# Patient Record
Sex: Male | Born: 1975 | Race: White | Hispanic: No | Marital: Married | Smoking: Current every day smoker
Health system: Southern US, Community
[De-identification: ages and names within clinical notes are randomized; demographics above are authoritative.]

## PROBLEM LIST (undated history)

## (undated) DIAGNOSIS — B019 Varicella without complication: Secondary | ICD-10-CM

## (undated) DIAGNOSIS — R519 Headache, unspecified: Secondary | ICD-10-CM

## (undated) DIAGNOSIS — R51 Headache: Secondary | ICD-10-CM

## (undated) HISTORY — PX: KNEE SURGERY: SHX244

## (undated) HISTORY — DX: Headache, unspecified: R51.9

## (undated) HISTORY — DX: Varicella without complication: B01.9

## (undated) HISTORY — DX: Headache: R51

---

## 2015-01-15 ENCOUNTER — Encounter: Payer: Self-pay | Admitting: Family Medicine

## 2015-01-15 ENCOUNTER — Ambulatory Visit (INDEPENDENT_AMBULATORY_CARE_PROVIDER_SITE_OTHER): Payer: 59 | Admitting: Family Medicine

## 2015-01-15 ENCOUNTER — Encounter (INDEPENDENT_AMBULATORY_CARE_PROVIDER_SITE_OTHER): Payer: Self-pay

## 2015-01-15 VITALS — BP 114/80 | HR 77 | Temp 98.7°F | Ht 73.5 in | Wt 250.5 lb

## 2015-01-15 DIAGNOSIS — R0683 Snoring: Secondary | ICD-10-CM

## 2015-01-15 DIAGNOSIS — R5383 Other fatigue: Secondary | ICD-10-CM

## 2015-01-15 DIAGNOSIS — G4733 Obstructive sleep apnea (adult) (pediatric): Secondary | ICD-10-CM | POA: Insufficient documentation

## 2015-01-15 DIAGNOSIS — E669 Obesity, unspecified: Secondary | ICD-10-CM | POA: Insufficient documentation

## 2015-01-15 DIAGNOSIS — R0681 Apnea, not elsewhere classified: Secondary | ICD-10-CM

## 2015-01-15 DIAGNOSIS — R0789 Other chest pain: Secondary | ICD-10-CM | POA: Insufficient documentation

## 2015-01-15 DIAGNOSIS — E66811 Obesity, class 1: Secondary | ICD-10-CM

## 2015-01-15 DIAGNOSIS — R071 Chest pain on breathing: Secondary | ICD-10-CM

## 2015-01-15 NOTE — Patient Instructions (Signed)
It was nice to see you today.  We will call with your lab results.   We will also call with the scheduling of your sleep study.  Follow up in 1-3 months.  Take care  Dr. Adriana Simas

## 2015-01-15 NOTE — Assessment & Plan Note (Addendum)
Atypical with normal EKG today. History not consistent with ischemia. Will continue to monitor and will consider referral to cardiology if persists.

## 2015-01-15 NOTE — Assessment & Plan Note (Signed)
Obtaining sleep study.

## 2015-01-15 NOTE — Progress Notes (Signed)
Pre visit review using our clinic review tool, if applicable. No additional management support is needed unless otherwise documented below in the visit note. 

## 2015-01-15 NOTE — Progress Notes (Signed)
Subjective:  Patient ID: Taylor Dalton, male    DOB: May 31, 1975  Age: 39 y.o. MRN: [id number]  CC: Fatigue, chest pain  HPI Taylor Dalton is a 39 y.o. male presents to the clinic today with the above complaints.  1) Fatigue  Has been going on for the past 2-3 months.  No recent illness.  He does not stress at his job.  Additionally, he has recently switched from days to nights.  His wife has also noted apnea while sleeping. He continues to snore loudly.  No exacerbating or relieving factors.  2) Chest pain  Has been occuring daily for the past month.  Pain is located centrally.   Described as a pressure sensation.  Lasts for a few mins to 15-20 mins then resolves spontaneously.  No association with exertion.  No relieving factors.  Exacerbated by breathing.  No associated nausea, vomiting, diaphoresis.   PMH, Surgical Hx, Family Hx, Social History reviewed and updated as below. Past Medical History  Diagnosis Date  . Chicken pox   . Headache    Past Surgical History  Procedure Laterality Date  . Knee surgery Right    Family History  Problem Relation Age of Onset  . Prostate cancer Paternal Grandmother   . Heart disease Father   . Cancer Brother    Social History  Substance Use Topics  . Smoking status: Former Smoker -- 1.00 packs/day    Types: Cigarettes    Quit date: 01/15/2012  . Smokeless tobacco: Never Used  . Alcohol Use: 1.2 - 1.8 oz/week    2-3 Standard drinks or equivalent per week   Review of Systems  Cardiovascular: Positive for chest pain.       Chest pain with breathing.  Gastrointestinal: Positive for diarrhea.  Neurological: Positive for headaches.  Psychiatric/Behavioral:       Stress.  All other systems negative.  Objective:   Today's Vitals: BP 114/80 mmHg  Pulse 77  Temp(Src) 98.7 F (37.1 C) (Oral)  Ht 6' 1.5" (1.867 m)  Wt 250 lb 8 oz (113.626 kg)  BMI 32.60 kg/m2  SpO2 97%  Physical Exam  Constitutional:  He is oriented to person, place, and time. He appears well-developed and well-nourished. No distress.  HENT:  Head: Normocephalic and atraumatic.  Mouth/Throat: No oropharyngeal exudate.  Eyes: Conjunctivae are normal. No scleral icterus.  Neck: Neck supple. No thyromegaly present.  Cardiovascular: Normal rate and regular rhythm.   No murmur heard. Pulmonary/Chest: Effort normal and breath sounds normal. No respiratory distress. He has no wheezes. He has no rales.  Chest wall mildly tender to palpation.  Abdominal: Soft. He exhibits no distension. There is no tenderness. There is no rebound and no guarding.  Musculoskeletal: Normal range of motion. He exhibits no edema.  Lymphadenopathy:    He has no cervical adenopathy.  Neurological: He is alert and oriented to person, place, and time.  Skin: Skin is warm and dry.  Psychiatric: He has a normal mood and affect.  Vitals reviewed.  EKG: normal EKG, normal sinus rhythm.  Assessment & Plan:   Problem List Items Addressed This Visit    Obesity (BMI 30.0-34.9)   Relevant Orders   Hemoglobin A1c   Comprehensive metabolic panel   Lipid panel   Apnea    Obtaining sleep study.      Relevant Orders   Split night study   Fatigue - Primary   Relevant Orders   CBC   TSH   Split night study  Chest pain    Atypical with normal EKG today. History not consistent with ischemia. Will continue to monitor and will consider referral to cardiology if persists.      Relevant Orders   EKG 12-Lead (Completed)   Snoring    Obtaining sleep study.      Relevant Orders   Split night study     Follow-up: 1-3 months.   Tommie Sams DO

## 2015-01-15 NOTE — Assessment & Plan Note (Signed)
Obtaining sleep study. 

## 2015-01-16 LAB — COMPREHENSIVE METABOLIC PANEL
ALT: 36 U/L (ref 0–53)
AST: 27 U/L (ref 0–37)
Albumin: 4.3 g/dL (ref 3.5–5.2)
Alkaline Phosphatase: 68 U/L (ref 39–117)
BUN: 12 mg/dL (ref 6–23)
CHLORIDE: 105 meq/L (ref 96–112)
CO2: 24 meq/L (ref 19–32)
Calcium: 9.6 mg/dL (ref 8.4–10.5)
Creatinine, Ser: 0.94 mg/dL (ref 0.40–1.50)
GFR: 94.47 mL/min (ref >60.00)
GLUCOSE: 79 mg/dL (ref 70–99)
POTASSIUM: 4.1 meq/L (ref 3.5–5.1)
SODIUM: 141 meq/L (ref 135–145)
Total Bilirubin: 0.4 mg/dL (ref 0.2–1.2)
Total Protein: 7.1 g/dL (ref 6.0–8.3)

## 2015-01-16 LAB — CBC
HCT: 48 % (ref 39.0–52.0)
Hemoglobin: 15.8 g/dL (ref 13.0–17.0)
MCHC: 32.9 g/dL (ref 30.0–36.0)
MCV: 84.3 fl (ref 78.0–100.0)
Platelets: 215 10*3/uL (ref 150.0–400.0)
RBC: 5.69 Mil/uL (ref 4.22–5.81)
RDW: 13.6 % (ref 11.5–15.5)
WBC: 9.1 10*3/uL (ref 4.0–10.5)

## 2015-01-16 LAB — HEMOGLOBIN A1C: HEMOGLOBIN A1C: 5.5 % (ref 4.6–6.5)

## 2015-01-16 LAB — LIPID PANEL
Cholesterol: 231 mg/dL — ABNORMAL HIGH (ref 0–200)
HDL: 57.3 mg/dL (ref >39.00)
LDL CALC: 139 mg/dL — AB (ref 0–99)
NONHDL: 173.22
Total CHOL/HDL Ratio: 4
Triglycerides: 171 mg/dL — ABNORMAL HIGH (ref 0.0–149.0)
VLDL: 34.2 mg/dL (ref 0.0–40.0)

## 2015-01-16 LAB — TSH: TSH: 1.3 u[IU]/mL (ref 0.35–4.50)

## 2016-03-03 ENCOUNTER — Encounter: Payer: Self-pay | Admitting: Family Medicine

## 2016-03-03 ENCOUNTER — Ambulatory Visit (INDEPENDENT_AMBULATORY_CARE_PROVIDER_SITE_OTHER): Payer: 59 | Admitting: Family Medicine

## 2016-03-03 DIAGNOSIS — R0681 Apnea, not elsewhere classified: Secondary | ICD-10-CM | POA: Diagnosis not present

## 2016-03-03 DIAGNOSIS — E669 Obesity, unspecified: Secondary | ICD-10-CM | POA: Diagnosis not present

## 2016-03-03 MED ORDER — LIRAGLUTIDE -WEIGHT MANAGEMENT 18 MG/3ML ~~LOC~~ SOPN: 0.6000 mg | PEN_INJECTOR | Freq: Every day | SUBCUTANEOUS | 0 refills | Status: DC

## 2016-03-03 NOTE — Progress Notes (Signed)
Subjective:  Patient ID: Taylor Dalton, male    DOB: [birth year]  Age: 40 y.o. MRN: [id number]  CC: Issues regarding sleep, discuss weight loss  HPI:  40 year old male with obesity and presumed OSA presents with the above issues.  Patient reports that he's not sleeping well. He states that he is waking up with a headache. He states that he finds it difficult to stay awake during the day. He reports significant snoring and witnessed apnea by his wife. At our last visit, I ordered a sleep study which she has not proceeded with.  Regarding his obesity, he would like to discuss weight loss options. He is particularly interested in weight loss medication. He states that he has been changing his dietary habits and has had some success. He is exercising very little. He does golf somewhat regularly. He states that his wife is on a weight loss medication and has lost a significant amount of weight. He would like to discuss starting this today.  Social Hx   Social History   Social History  . Marital status: Married    Spouse name: N/A  . Number of children: N/A  . Years of education: N/A   Social History Main Topics  . Smoking status: Former Smoker    Packs/day: 1.00    Types: Cigarettes    Quit date: 01/15/2012  . Smokeless tobacco: Never Used  . Alcohol use 1.2 - 1.8 oz/week    2 - 3 Standard drinks or equivalent per week  . Drug use: No  . Sexual activity: Yes   Other Topics Concern  . None   Social History Narrative  . None    Review of Systems  Constitutional: Positive for fatigue.  Respiratory: Positive for apnea.   Psychiatric/Behavioral: Positive for sleep disturbance.   Objective:  BP 118/82 (BP Location: Right Arm, Patient Position: Sitting, Cuff Size: Normal)   Pulse (!) 112   Temp 98.5 F (36.9 C) (Oral)   Resp 16   Wt 246 lb 2 oz (111.6 kg)   SpO2 96%   BMI 32.03 kg/m   BP/Weight 03/03/2016 01/15/2015  Systolic BP 118 114  Diastolic BP 82 80  Wt. (Lbs)  246.13 250.5  BMI 32.03 32.6   Physical Exam  Constitutional: He is oriented to person, place, and time. He appears well-developed. No distress.  Cardiovascular: Regular rhythm.  Tachycardia present.   Pulmonary/Chest: Effort normal. He has no wheezes. He has no rales.  Neurological: He is alert and oriented to person, place, and time.  Psychiatric: He has a normal mood and affect.  Vitals reviewed.  Lab Results  Component Value Date   WBC 9.1 01/15/2015   HGB 15.8 01/15/2015   HCT 48.0 01/15/2015   PLT 215.0 01/15/2015   GLUCOSE 79 01/15/2015   CHOL 231 (H) 01/15/2015   TRIG 171.0 (H) 01/15/2015   HDL 57.30 01/15/2015   LDLCALC 139 (H) 01/15/2015   ALT 36 01/15/2015   AST 27 01/15/2015   NA 141 01/15/2015   K 4.1 01/15/2015   CL 105 01/15/2015   CREATININE 0.94 01/15/2015   BUN 12 01/15/2015   CO2 24 01/15/2015   TSH 1.30 01/15/2015   HGBA1C 5.5 01/15/2015    Assessment & Plan:   Problem List Items Addressed This Visit    Obesity (BMI 30.0-34.9)    Established problem, improving slightly. After discussion, patient was adamant about starting weight loss medication. Trial of Saxenda.      Relevant Medications  Liraglutide -Weight Management (SAXENDA) 18 MG/3ML SOPN   Apnea    Established problem, worsening. Patient sleep disturbance is secondary to obstructive sleep apnea. Patient needs sleep study to confirm and to titrate CPAP. Also needs to lose weight.      Relevant Orders   Split night study      Meds ordered this encounter  Medications  . Liraglutide -Weight Management (SAXENDA) 18 MG/3ML SOPN    Sig: Inject 0.6 mg into the skin daily. Increase by 0.6 mg daily at weekly intervals to a target dose of 3 mg once daily    Dispense:  15 mL    Refill:  0    Follow-up: 3 months.  Everlene OtherJayce Camry Robello DO [facility]

## 2016-03-03 NOTE — Assessment & Plan Note (Signed)
Established problem, improving slightly. After discussion, patient was adamant about starting weight loss medication. Trial of Saxenda.

## 2016-03-03 NOTE — Assessment & Plan Note (Signed)
Established problem, worsening. Patient sleep disturbance is secondary to obstructive sleep apnea. Patient needs sleep study to confirm and to titrate CPAP. Also needs to lose weight.

## 2016-03-03 NOTE — Patient Instructions (Signed)
We will call regarding your sleep study.  Try the saxenda.  Follow up in 3 months.  Take care  Dr. Adriana Simasook

## 2016-03-03 NOTE — Progress Notes (Signed)
Pre visit review using our clinic review tool, if applicable. No additional management support is needed unless otherwise documented below in the visit note. 

## 2016-03-04 ENCOUNTER — Encounter: Payer: Self-pay | Admitting: Family Medicine

## 2016-03-05 ENCOUNTER — Telehealth: Payer: Self-pay

## 2016-03-05 NOTE — Telephone Encounter (Signed)
PA for Saxenda completed on Cover my meds 

## 2016-03-21 ENCOUNTER — Encounter: Payer: Self-pay | Admitting: Family Medicine

## 2016-05-06 ENCOUNTER — Ambulatory Visit: Payer: Commercial Managed Care - PPO | Attending: Otolaryngology

## 2016-05-06 DIAGNOSIS — G4733 Obstructive sleep apnea (adult) (pediatric): Secondary | ICD-10-CM | POA: Insufficient documentation

## 2016-05-06 DIAGNOSIS — R0683 Snoring: Secondary | ICD-10-CM | POA: Insufficient documentation

## 2016-05-12 ENCOUNTER — Other Ambulatory Visit: Payer: Self-pay | Admitting: Family Medicine

## 2016-05-13 ENCOUNTER — Other Ambulatory Visit: Payer: Self-pay | Admitting: Family Medicine

## 2016-05-13 NOTE — Telephone Encounter (Signed)
Refilled 03/03/16. Pt last seen 03/03/16. Please advise?

## 2016-05-15 ENCOUNTER — Telehealth: Payer: Self-pay | Admitting: *Deleted

## 2016-05-15 NOTE — Telephone Encounter (Signed)
Pt requested sleep study results if they are available  Pt contact [phone]

## 2016-05-15 NOTE — Telephone Encounter (Signed)
Has moderate to severe OSA. Did not have enough events for split night study. Needs study for titration of CPAP.

## 2016-05-15 NOTE — Telephone Encounter (Signed)
Called pt unable to leave voicemail due to inbox full.

## 2016-05-15 NOTE — Telephone Encounter (Signed)
No results available yet (to me)

## 2016-05-15 NOTE — Telephone Encounter (Signed)
Information is in the patients chart scanned. Printed for PCP.

## 2016-05-16 NOTE — Telephone Encounter (Signed)
Unable to leave a voicemail due to bother numbers not having the ability to leave a message. Pleas have pt transferred back to me.

## 2016-05-16 NOTE — Telephone Encounter (Signed)
Pt okay with titration study.

## 2016-05-16 NOTE — Telephone Encounter (Signed)
Asked to call this number [phone]

## 2016-05-16 NOTE — Telephone Encounter (Signed)
Pt returned your call. [phone]

## 2016-05-19 ENCOUNTER — Other Ambulatory Visit: Payer: Self-pay | Admitting: Family Medicine

## 2016-05-19 DIAGNOSIS — G4733 Obstructive sleep apnea (adult) (pediatric): Secondary | ICD-10-CM

## 2016-05-20 ENCOUNTER — Other Ambulatory Visit: Payer: Self-pay | Admitting: Family Medicine

## 2016-05-20 ENCOUNTER — Encounter: Payer: Self-pay | Admitting: Family Medicine

## 2016-05-26 ENCOUNTER — Telehealth: Payer: Self-pay | Admitting: *Deleted

## 2016-05-26 NOTE — Telephone Encounter (Signed)
Pt requested a call for the status of his sleep study referral  Pt contact [phone]

## 2016-06-05 ENCOUNTER — Ambulatory Visit (INDEPENDENT_AMBULATORY_CARE_PROVIDER_SITE_OTHER): Payer: Commercial Managed Care - PPO | Admitting: Family Medicine

## 2016-06-05 VITALS — BP 125/78 | HR 81 | Temp 97.8°F | Wt 239.6 lb

## 2016-06-05 DIAGNOSIS — Z130 Encounter for screening for diseases of the blood and blood-forming organs and certain disorders involving the immune mechanism: Secondary | ICD-10-CM

## 2016-06-05 DIAGNOSIS — E669 Obesity, unspecified: Secondary | ICD-10-CM | POA: Diagnosis not present

## 2016-06-05 DIAGNOSIS — E785 Hyperlipidemia, unspecified: Secondary | ICD-10-CM | POA: Diagnosis not present

## 2016-06-05 DIAGNOSIS — G4733 Obstructive sleep apnea (adult) (pediatric): Secondary | ICD-10-CM | POA: Diagnosis not present

## 2016-06-05 LAB — CBC
HCT: 44.6 % (ref 39.0–52.0)
Hemoglobin: 15.1 g/dL (ref 13.0–17.0)
MCHC: 33.8 g/dL (ref 30.0–36.0)
MCV: 83.3 fl (ref 78.0–100.0)
PLATELETS: 200 10*3/uL (ref 150.0–400.0)
RBC: 5.36 Mil/uL (ref 4.22–5.81)
RDW: 13.8 % (ref 11.5–15.5)
WBC: 6.9 10*3/uL (ref 4.0–10.5)

## 2016-06-05 LAB — LIPID PANEL
CHOL/HDL RATIO: 4
CHOLESTEROL: 208 mg/dL — AB (ref 0–200)
HDL: 48.8 mg/dL (ref >39.00)
LDL CALC: 140 mg/dL — AB (ref 0–99)
NonHDL: 159.61
Triglycerides: 99 mg/dL (ref 0.0–149.0)
VLDL: 19.8 mg/dL (ref 0.0–40.0)

## 2016-06-05 LAB — COMPREHENSIVE METABOLIC PANEL
ALT: 32 U/L (ref 0–53)
AST: 23 U/L (ref 0–37)
Albumin: 4.2 g/dL (ref 3.5–5.2)
Alkaline Phosphatase: 66 U/L (ref 39–117)
BUN: 12 mg/dL (ref 6–23)
CHLORIDE: 107 meq/L (ref 96–112)
CO2: 31 meq/L (ref 19–32)
Calcium: 9.2 mg/dL (ref 8.4–10.5)
Creatinine, Ser: 0.85 mg/dL (ref 0.40–1.50)
GFR: 105.38 mL/min (ref >60.00)
Glucose, Bld: 106 mg/dL — ABNORMAL HIGH (ref 70–99)
POTASSIUM: 3.9 meq/L (ref 3.5–5.1)
SODIUM: 141 meq/L (ref 135–145)
Total Bilirubin: 0.4 mg/dL (ref 0.2–1.2)
Total Protein: 6.6 g/dL (ref 6.0–8.3)

## 2016-06-05 LAB — HEMOGLOBIN A1C: HEMOGLOBIN A1C: 5.5 % (ref 4.6–6.5)

## 2016-06-05 NOTE — Assessment & Plan Note (Signed)
Doing well. Losing weight on Saxenda. Will continue. Follow up in 4 months.

## 2016-06-05 NOTE — Progress Notes (Signed)
Pre visit review using our clinic review tool, if applicable. No additional management support is needed unless otherwise documented below in the visit note. 

## 2016-06-05 NOTE — Progress Notes (Signed)
Subjective:  Patient ID: Taylor Dalton, male    DOB: 18-Sep-1975  Age: 41 y.o. MRN: [id number]  CC: Follow up   HPI:  41 year old male presents for follow-up.  Obesity  Patient has been losing weight following start of Saxenda.  He has not been on this particularly long as he had issues regarding his insurance.  No reported side effects.  He would like to continue.  OSA  Uncontrolled. Patient has a diagnosis of sleep apnea based on a recent sleep study.  Per the sleep study he did not qualify for split night study and he is scheduled for a titration study.  He continues to have significant apnea, fatigue, and heavy snoring.  Upcoming titration study in March.  Social Hx   Social History   Social History  . Marital status: Married    Spouse name: N/A  . Number of children: N/A  . Years of education: N/A   Social History Main Topics  . Smoking status: Former Smoker    Packs/day: 1.00    Types: Cigarettes    Quit date: 01/15/2012  . Smokeless tobacco: Never Used  . Alcohol use 1.2 - 1.8 oz/week    2 - 3 Standard drinks or equivalent per week  . Drug use: No  . Sexual activity: Yes   Other Topics Concern  . Not on file   Social History Narrative  . No narrative on file   Review of Systems  Constitutional: Negative.   Respiratory: Positive for apnea.    Objective:  BP 125/78   Pulse 81   Temp 97.8 F (36.6 C) (Oral)   Wt 239 lb 9.6 oz (108.7 kg)   SpO2 95%   BMI 31.18 kg/m   BP/Weight 06/05/2016 03/03/2016 01/15/2015  Systolic BP 125 118 114  Diastolic BP 78 82 80  Wt. (Lbs) 239.6 246.13 250.5  BMI 31.18 32.03 32.6   Physical Exam  Constitutional: He is oriented to person, place, and time. He appears well-developed. No distress.  Cardiovascular: Normal rate and regular rhythm.   Pulmonary/Chest: Effort normal and breath sounds normal.  Abdominal: Soft. He exhibits no distension. There is no tenderness. There is no rebound and no guarding.    Neurological: He is alert and oriented to person, place, and time.  Psychiatric: He has a normal mood and affect.  Vitals reviewed.  Lab Results  Component Value Date   WBC 9.1 01/15/2015   HGB 15.8 01/15/2015   HCT 48.0 01/15/2015   PLT 215.0 01/15/2015   GLUCOSE 79 01/15/2015   CHOL 231 (H) 01/15/2015   TRIG 171.0 (H) 01/15/2015   HDL 57.30 01/15/2015   LDLCALC 139 (H) 01/15/2015   ALT 36 01/15/2015   AST 27 01/15/2015   NA 141 01/15/2015   K 4.1 01/15/2015   CL 105 01/15/2015   CREATININE 0.94 01/15/2015   BUN 12 01/15/2015   CO2 24 01/15/2015   TSH 1.30 01/15/2015   HGBA1C 5.5 01/15/2015    Assessment & Plan:   Problem List Items Addressed This Visit    OSA (obstructive sleep apnea)    Established problem, uncontrolled. Awaiting titration study so that he can get CPAP.      Obesity (BMI 30.0-34.9) - Primary    Doing well. Losing weight on Saxenda. Will continue. Follow up in 4 months.      Relevant Orders   Hemoglobin A1c   Comprehensive metabolic panel    Other Visit Diagnoses    Hyperlipidemia, unspecified hyperlipidemia type  Relevant Orders   Lipid panel   Screening for deficiency anemia       Relevant Orders   CBC     Follow-up: 4 months  Taylor Bahe Adriana Simas DO [facility]

## 2016-06-05 NOTE — Patient Instructions (Signed)
Continue the Saxenda.   Be sure to get your titration study.  We will call with your labs.  Follow up in 4 months (recommended since on Saxenda).  Take care  Dr. Adriana Simasook

## 2016-06-05 NOTE — Assessment & Plan Note (Signed)
Established problem, uncontrolled. Awaiting titration study so that he can get CPAP.

## 2016-06-17 ENCOUNTER — Other Ambulatory Visit: Payer: Self-pay | Admitting: Family Medicine

## 2016-06-17 NOTE — Telephone Encounter (Signed)
Refilled 05/13/16. Pt last seen 06/05/16. Please advise?

## 2016-07-15 ENCOUNTER — Other Ambulatory Visit: Payer: Self-pay | Admitting: Family Medicine

## 2016-07-16 ENCOUNTER — Other Ambulatory Visit: Payer: Self-pay | Admitting: Family Medicine

## 2016-07-16 MED ORDER — LIRAGLUTIDE -WEIGHT MANAGEMENT 18 MG/3ML ~~LOC~~ SOPN: 3.0000 mg | PEN_INJECTOR | Freq: Every day | SUBCUTANEOUS | 0 refills | Status: DC

## 2016-07-16 NOTE — Telephone Encounter (Signed)
Refilled: 06/18/16 Last OV: 06/05/16 Last Labs: 10/03/16 Future OV: 06/05/16 Please advise?

## 2016-08-30 ENCOUNTER — Other Ambulatory Visit: Payer: Self-pay | Admitting: Family Medicine

## 2016-09-01 MED ORDER — LIRAGLUTIDE -WEIGHT MANAGEMENT 18 MG/3ML ~~LOC~~ SOPN: 3.0000 mg | PEN_INJECTOR | Freq: Every day | SUBCUTANEOUS | 0 refills | Status: DC

## 2016-09-01 NOTE — Telephone Encounter (Signed)
Refilled: 07/16/16 Last OV: 06/05/16 Last Labs: 06/05/16 Future OV: 10/03/16 Please advise?

## 2016-10-03 ENCOUNTER — Encounter: Payer: Self-pay | Admitting: Family Medicine

## 2016-10-03 ENCOUNTER — Ambulatory Visit (INDEPENDENT_AMBULATORY_CARE_PROVIDER_SITE_OTHER): Payer: Commercial Managed Care - PPO | Admitting: Family Medicine

## 2016-10-03 VITALS — BP 118/72 | HR 75 | Temp 98.4°F | Wt 233.0 lb

## 2016-10-03 DIAGNOSIS — E669 Obesity, unspecified: Secondary | ICD-10-CM | POA: Diagnosis not present

## 2016-10-03 DIAGNOSIS — M62838 Other muscle spasm: Secondary | ICD-10-CM | POA: Diagnosis not present

## 2016-10-03 DIAGNOSIS — E785 Hyperlipidemia, unspecified: Secondary | ICD-10-CM | POA: Insufficient documentation

## 2016-10-03 MED ORDER — CYCLOBENZAPRINE HCL 10 MG PO TABS: 10.0000 mg | ORAL_TABLET | Freq: Every evening | ORAL | 0 refills | Status: AC | PRN

## 2016-10-03 MED ORDER — LIRAGLUTIDE -WEIGHT MANAGEMENT 18 MG/3ML ~~LOC~~ SOPN: 3.0000 mg | PEN_INJECTOR | Freq: Every day | SUBCUTANEOUS | 2 refills | Status: DC

## 2016-10-03 NOTE — Assessment & Plan Note (Signed)
New problem. Flexeril as needed. Wants to see massage therapist.

## 2016-10-03 NOTE — Assessment & Plan Note (Signed)
Doing well. Losing weight. Continue Saxenda.

## 2016-10-03 NOTE — Patient Instructions (Signed)
Flexeril as needed for spasm. See a Masseuse.  Continue the Saxenda.  Follow up in 6 months.  Take care  Dr. Adriana Simasook

## 2016-10-03 NOTE — Progress Notes (Signed)
Subjective:  Patient ID: Taylor Dalton, male    DOB: 10-03-1975  Age: 41 y.o. MRN: [id number]  CC: Shoulder/scapular pain, Medication refill  HPI:  41 year old male with OSA, obesity, and hyperlipidemia presents for follow-up.  Obesity  Has lost 13 pounds on Saxenda.  No adverse effects.  He's happy with his current results.  He would like to continue. Needs refill today.  Right shoulder pain/periscapular pain  Has had prior injury.  He states that 2 weeks ago he slipped in the garden and injured his right shoulder. He had radicular symptoms at that time. He saw a chiropractor with improvement.  He states that he's been doing fairly well, but is now developed "knots" in his upper back/periscapular region.  Tender to palpation. No known relieving factors.  He would like to address this today.  Social Hx   Social History   Social History  . Marital status: Married    Spouse name: N/A  . Number of children: N/A  . Years of education: N/A   Social History Main Topics  . Smoking status: Former Smoker    Packs/day: 1.00    Types: Cigarettes    Quit date: 01/15/2012  . Smokeless tobacco: Never Used  . Alcohol use 1.2 - 1.8 oz/week    2 - 3 Standard drinks or equivalent per week  . Drug use: No  . Sexual activity: Yes   Other Topics Concern  . None   Social History Narrative  . None    Review of Systems  Constitutional: Negative.   Musculoskeletal:       Shoudler/scapular pain.   Objective:  BP 118/72 (BP Location: Left Arm, Patient Position: Sitting, Cuff Size: Normal)   Pulse 75   Temp 98.4 F (36.9 C) (Oral)   Wt 233 lb (105.7 kg)   SpO2 98%   BMI 30.32 kg/m   BP/Weight 10/03/2016 06/05/2016 03/03/2016  Systolic BP 118 125 118  Diastolic BP 72 78 82  Wt. (Lbs) 233 239.6 246.13  BMI 30.32 31.18 32.03   Physical Exam  Constitutional: He is oriented to person, place, and time. He appears well-developed. No distress.  Cardiovascular: Normal rate  and regular rhythm.   Pulmonary/Chest: Effort normal and breath sounds normal.  Musculoskeletal:  Periscapular region with muscle spasm.  Neurological: He is alert and oriented to person, place, and time.  Psychiatric: He has a normal mood and affect.  Vitals reviewed.   Lab Results  Component Value Date   WBC 6.9 06/05/2016   HGB 15.1 06/05/2016   HCT 44.6 06/05/2016   PLT 200.0 06/05/2016   GLUCOSE 106 (H) 06/05/2016   CHOL 208 (H) 06/05/2016   TRIG 99.0 06/05/2016   HDL 48.80 06/05/2016   LDLCALC 140 (H) 06/05/2016   ALT 32 06/05/2016   AST 23 06/05/2016   NA 141 06/05/2016   K 3.9 06/05/2016   CL 107 06/05/2016   CREATININE 0.85 06/05/2016   BUN 12 06/05/2016   CO2 31 06/05/2016   TSH 1.30 01/15/2015   HGBA1C 5.5 06/05/2016    Assessment & Plan:   Problem List Items Addressed This Visit    Obesity (BMI 30.0-34.9) - Primary    Doing well. Losing weight. Continue Saxenda.      Relevant Medications   Liraglutide -Weight Management (SAXENDA) 18 MG/3ML SOPN   Muscle spasm    New problem. Flexeril as needed. Wants to see massage therapist.      Relevant Orders   Ambulatory referral to Physical  Therapy      Meds ordered this encounter  Medications  . Liraglutide -Weight Management (SAXENDA) 18 MG/3ML SOPN    Sig: Inject 3 mg into the skin daily.    Dispense:  15 pen    Refill:  2  . cyclobenzaprine (FLEXERIL) 10 MG tablet    Sig: Take 1 tablet (10 mg total) by mouth at bedtime as needed for muscle spasms.    Dispense:  30 tablet    Refill:  0    Follow-up: 6 months  Teirra Carapia Adriana Simasook DO [facility] [place] [facility]

## 2016-10-24 ENCOUNTER — Encounter: Payer: Self-pay | Admitting: Family Medicine

## 2016-10-24 MED ORDER — LIRAGLUTIDE -WEIGHT MANAGEMENT 18 MG/3ML ~~LOC~~ SOPN: 3.0000 mg | PEN_INJECTOR | Freq: Every day | SUBCUTANEOUS | 3 refills | Status: DC

## 2016-12-04 ENCOUNTER — Observation Stay (HOSPITAL_COMMUNITY)
Admission: EM | Admit: 2016-12-04 | Discharge: 2016-12-05 | Disposition: A | Discharge status: Home / Self Care | Payer: Commercial Managed Care - PPO | Attending: Family Medicine | Admitting: Family Medicine

## 2016-12-04 ENCOUNTER — Other Ambulatory Visit: Payer: Self-pay

## 2016-12-04 ENCOUNTER — Encounter (HOSPITAL_COMMUNITY): Payer: Self-pay | Admitting: Emergency Medicine

## 2016-12-04 ENCOUNTER — Emergency Department (HOSPITAL_COMMUNITY): Payer: Commercial Managed Care - PPO

## 2016-12-04 DIAGNOSIS — I200 Unstable angina: Secondary | ICD-10-CM | POA: Diagnosis not present

## 2016-12-04 DIAGNOSIS — Z79899 Other long term (current) drug therapy: Secondary | ICD-10-CM | POA: Insufficient documentation

## 2016-12-04 DIAGNOSIS — Z87891 Personal history of nicotine dependence: Secondary | ICD-10-CM | POA: Insufficient documentation

## 2016-12-04 DIAGNOSIS — E669 Obesity, unspecified: Secondary | ICD-10-CM | POA: Diagnosis present

## 2016-12-04 DIAGNOSIS — R0789 Other chest pain: Secondary | ICD-10-CM | POA: Diagnosis present

## 2016-12-04 DIAGNOSIS — R079 Chest pain, unspecified: Secondary | ICD-10-CM | POA: Diagnosis present

## 2016-12-04 DIAGNOSIS — E66811 Obesity, class 1: Secondary | ICD-10-CM | POA: Diagnosis present

## 2016-12-04 DIAGNOSIS — E785 Hyperlipidemia, unspecified: Secondary | ICD-10-CM | POA: Diagnosis present

## 2016-12-04 LAB — I-STAT TROPONIN, ED: Troponin i, poc: 0 ng/mL (ref 0.00–0.08)

## 2016-12-04 LAB — BASIC METABOLIC PANEL
Anion gap: 10 (ref 5–15)
BUN: 11 mg/dL (ref 6–20)
CHLORIDE: 108 mmol/L (ref 101–111)
CO2: 22 mmol/L (ref 22–32)
CREATININE: 0.86 mg/dL (ref 0.61–1.24)
Calcium: 9.1 mg/dL (ref 8.9–10.3)
GFR calc non Af Amer: >60 mL/min (ref >60)
Glucose, Bld: 93 mg/dL (ref 65–99)
POTASSIUM: 3.9 mmol/L (ref 3.5–5.1)
SODIUM: 140 mmol/L (ref 135–145)

## 2016-12-04 LAB — CBC
HEMATOCRIT: 44.3 % (ref 39.0–52.0)
HEMOGLOBIN: 15.3 g/dL (ref 13.0–17.0)
MCH: 28.1 pg (ref 26.0–34.0)
MCHC: 34.5 g/dL (ref 30.0–36.0)
MCV: 81.4 fL (ref 78.0–100.0)
Platelets: 201 10*3/uL (ref 150–400)
RBC: 5.44 MIL/uL (ref 4.22–5.81)
RDW: 13 % (ref 11.5–15.5)
WBC: 7.8 10*3/uL (ref 4.0–10.5)

## 2016-12-04 NOTE — ED Triage Notes (Signed)
Pt sts left sided CP with radiation to left arm and some tightness

## 2016-12-05 ENCOUNTER — Observation Stay (HOSPITAL_BASED_OUTPATIENT_CLINIC_OR_DEPARTMENT_OTHER): Payer: Commercial Managed Care - PPO

## 2016-12-05 DIAGNOSIS — R079 Chest pain, unspecified: Secondary | ICD-10-CM

## 2016-12-05 DIAGNOSIS — I200 Unstable angina: Secondary | ICD-10-CM | POA: Diagnosis not present

## 2016-12-05 DIAGNOSIS — E669 Obesity, unspecified: Secondary | ICD-10-CM

## 2016-12-05 DIAGNOSIS — E7800 Pure hypercholesterolemia, unspecified: Secondary | ICD-10-CM

## 2016-12-05 DIAGNOSIS — Z87891 Personal history of nicotine dependence: Secondary | ICD-10-CM | POA: Diagnosis not present

## 2016-12-05 DIAGNOSIS — Z79899 Other long term (current) drug therapy: Secondary | ICD-10-CM | POA: Diagnosis not present

## 2016-12-05 LAB — EXERCISE TOLERANCE TEST
CHL CUP MPHR: 179 {beats}/min
CSEPED: 10 min
CSEPPHR: 176 {beats}/min
Estimated workload: 11.7 METS
Percent HR: 98 %
Rest HR: 57 {beats}/min

## 2016-12-05 LAB — HIV ANTIBODY (ROUTINE TESTING W REFLEX): HIV SCREEN 4TH GENERATION: NONREACTIVE

## 2016-12-05 LAB — RAPID URINE DRUG SCREEN, HOSP PERFORMED
AMPHETAMINES: NOT DETECTED
BENZODIAZEPINES: NOT DETECTED
Barbiturates: NOT DETECTED
Cocaine: NOT DETECTED
OPIATES: NOT DETECTED
Tetrahydrocannabinol: NOT DETECTED

## 2016-12-05 LAB — ECHOCARDIOGRAM COMPLETE
Height: 75 in
WEIGHTICAEL: 3656 [oz_av]

## 2016-12-05 LAB — TROPONIN I: Troponin I: <0.03 ng/mL (ref <0.03)

## 2016-12-05 LAB — LIPID PANEL
Cholesterol: 205 mg/dL — ABNORMAL HIGH (ref 0–200)
HDL: 44 mg/dL (ref >40)
LDL Cholesterol: 129 mg/dL — ABNORMAL HIGH (ref 0–99)
TRIGLYCERIDES: 161 mg/dL — AB (ref <150)
Total CHOL/HDL Ratio: 4.7 RATIO
VLDL: 32 mg/dL (ref 0–40)

## 2016-12-05 LAB — D-DIMER, QUANTITATIVE (NOT AT ARMC): D DIMER QUANT: 0.28 ug{FEU}/mL (ref 0.00–0.50)

## 2016-12-05 LAB — I-STAT TROPONIN, ED: Troponin i, poc: 0 ng/mL (ref 0.00–0.08)

## 2016-12-05 LAB — HEMOGLOBIN A1C
Hgb A1c MFr Bld: 5.2 % (ref 4.8–5.6)
MEAN PLASMA GLUCOSE: 102.54 mg/dL

## 2016-12-05 MED ORDER — SODIUM CHLORIDE 0.9 % IV SOLN
INTRAVENOUS | Status: DC
  Administered 2016-12-05: 04:00:00 via INTRAVENOUS

## 2016-12-05 MED ORDER — ZOLPIDEM TARTRATE 5 MG PO TABS
5.0000 mg | ORAL_TABLET | Freq: Every evening | ORAL | Status: DC | PRN
  Filled 2016-12-05: qty 1

## 2016-12-05 MED ORDER — MORPHINE SULFATE (PF) 4 MG/ML IV SOLN
2.0000 mg | INTRAVENOUS | Status: DC | PRN
Start: 2016-12-05 — End: 2016-12-05
  Filled 2016-12-05: qty 1

## 2016-12-05 MED ORDER — ASPIRIN 81 MG PO CHEW
324.0000 mg | CHEWABLE_TABLET | Freq: Every day | ORAL | Status: DC
  Filled 2016-12-05: qty 4

## 2016-12-05 MED ORDER — LIRAGLUTIDE -WEIGHT MANAGEMENT 18 MG/3ML ~~LOC~~ SOPN: 3.0000 mg | PEN_INJECTOR | Freq: Every day | SUBCUTANEOUS | Status: DC

## 2016-12-05 MED ORDER — ENOXAPARIN SODIUM 40 MG/0.4ML ~~LOC~~ SOLN
40.0000 mg | Freq: Every day | SUBCUTANEOUS | Status: DC
  Administered 2016-12-05: 40 mg via SUBCUTANEOUS
  Filled 2016-12-05: qty 0.4

## 2016-12-05 MED ORDER — ONDANSETRON HCL 4 MG/2ML IJ SOLN: 4.0000 mg | Freq: Four times a day (QID) | INTRAMUSCULAR | Status: DC | PRN

## 2016-12-05 MED ORDER — ASPIRIN 81 MG PO CHEW
324.0000 mg | CHEWABLE_TABLET | Freq: Once | ORAL | Status: AC
  Administered 2016-12-05: 324 mg via ORAL
  Filled 2016-12-05: qty 4

## 2016-12-05 MED ORDER — CYCLOBENZAPRINE HCL 10 MG PO TABS: 10.0000 mg | ORAL_TABLET | Freq: Every evening | ORAL | Status: DC | PRN

## 2016-12-05 MED ORDER — ACETAMINOPHEN 325 MG PO TABS
650.0000 mg | ORAL_TABLET | Freq: Once | ORAL | Status: AC
  Administered 2016-12-05: 650 mg via ORAL
  Filled 2016-12-05: qty 2

## 2016-12-05 MED ORDER — NITROGLYCERIN 0.4 MG SL SUBL
0.4000 mg | SUBLINGUAL_TABLET | SUBLINGUAL | Status: DC | PRN
  Administered 2016-12-05 (×2): 0.4 mg via SUBLINGUAL
  Filled 2016-12-05: qty 1

## 2016-12-05 MED ORDER — ACETAMINOPHEN 325 MG PO TABS: 650.0000 mg | ORAL_TABLET | ORAL | Status: DC | PRN

## 2016-12-05 NOTE — Discharge Summary (Signed)
Physician Discharge Summary  Taylor Dalton:[id number] DOB: [birth year] DOA: 12/04/2016  PCP: Taylor Dalton  Admit date: 12/04/2016 Discharge date: 12/05/2016  Admitted From: Home  Disposition: Home  Recommendations for Outpatient Follow-up:  1. Follow up with PCP in 1 weeks 2. Follow up with cardiology in 6 weeks   Discharge Condition: STABLE  CODE STATUS:  FULL    Brief Hospitalization Summary: Please see all hospital notes, images, labs for full details of the hospitalization. HPI: Taylor Dalton is a 41 y.o. male with medical history significant of obesity, hyperlipidemia, OSA not on CPAP, who presents with chest pain.  Patient states that his chest pain started in this afternoon when he was at working. It is located in substernal area and also in left lower chest, constant, pressure-like, 4 out of 10 in severity, radiating to the left arm. No shortness of breath, cough, fever or chills. No tenderness in the calf areas. No recent long distant traveling. Patient has nausea, no vomiting, diarrhea, abdominal pain, symptoms of UTI or unilateral weakness. Pt states that his father had heart attack at 9s without significant medical issues at that time.  ED Course: pt was found to have negative d-dimer, negative troponin, WBC 7.8, electrolytes renal function okay, temperature normal, soft blood pressure, slightly tachycardia, O2 sat 93% on room air. Chest x-ray negative. Patient is placed on telemetry bed for observation.  Chest pain, atypical  - negative troponin x 3, negative exercise stress test, cardiology evaluated and said that safe for discharge home, can follow up with Dr. Rennis Golden outpatient for risk factor modification.  Echo Left ventricle: The cavity size was normal. Systolic function was  normal. The estimated ejection fraction was in the range of 55%  to 60%. Wall motion was normal; there were no regional wall  motion abnormalities. Left ventricular diastolic function    parameters were normal. Doppler parameters are consistent with  high ventricular filling pressure.   Discharge Diagnoses:  Principal Problem:   Chest pain Active Problems:   Obesity (BMI 30.0-34.9)   Hyperlipidemia   Unstable angina Taylor Dalton)    Discharge Instructions: Discharge Instructions    Call Dalton for:  extreme fatigue    Complete by:  As directed    Call Dalton for:  persistant dizziness or light-headedness    Complete by:  As directed    Call Dalton for:  persistant nausea and vomiting    Complete by:  As directed    Call Dalton for:  severe uncontrolled pain    Complete by:  As directed    Increase activity slowly    Complete by:  As directed      Allergies as of 12/05/2016   No Known Allergies     Medication List    TAKE these medications   BC HEADACHE POWDER PO Take 1 Package by mouth daily as needed (pain).   cyclobenzaprine 10 MG tablet Commonly known as:  FLEXERIL Take 1 tablet (10 mg total) by mouth at bedtime as needed for muscle spasms.   Liraglutide -Weight Management 18 MG/3ML Sopn Commonly known as:  SAXENDA Inject 3 mg into the skin daily.      Follow-up Information    Taylor Dalton. Schedule an appointment as soon as possible for a visit in 1 week(s).   Specialty:  Family Medicine Contact information: [address] [place] [address] [place] [address] [phone]        Taylor Dalton. Schedule an appointment as soon as  possible for a visit in 6 week(s).   Specialty:  Cardiology Why:  Hospital Follow Up  Contact information: [address] [place] [address] [place] [address] [phone]          No Known Allergies Current Discharge Medication List    CONTINUE these medications which have NOT CHANGED   Details  Aspirin-Salicylamide-Caffeine (BC HEADACHE POWDER PO) Take 1 Package by mouth daily as needed (pain).    cyclobenzaprine (FLEXERIL) 10 MG tablet Take 1 tablet (10 mg total) by mouth at bedtime as needed for muscle  spasms. Qty: 30 tablet, Refills: 0    Liraglutide -Weight Management (SAXENDA) 18 MG/3ML SOPN Inject 3 mg into the skin daily. Qty: 15 pen, Refills: 3        Procedures/Studies: Dg Chest 2 View  Result Date: 12/04/2016 CLINICAL DATA:  Left-sided chest pain. EXAM: CHEST  2 VIEW COMPARISON:  None. FINDINGS: The heart size and mediastinal contours are within normal limits. Both lungs are clear. The visualized skeletal structures are unremarkable. IMPRESSION: No active cardiopulmonary disease. Electronically Signed   By: Gerome Sam III M.D   On: 12/04/2016 18:56      Subjective: Pt without pain of chest and denies SOB, tolerated stress test well.    Discharge Exam: Vitals:   12/05/16 0626 12/05/16 1300  BP: 104/62 106/61  Pulse: (!) 55 (!) 59  Resp: 16 15  Temp: 97.6 F (36.4 C) 98 F (36.7 C)  SpO2: 99% 99%   Vitals:   12/05/16 0400 12/05/16 0500 12/05/16 0626 12/05/16 1300  BP: 110/70 95/63 104/62 106/61  Pulse: (!) 59 (!) 54 (!) 55 (!) 59  Resp: 16 14 16 15   Temp:   97.6 F (36.4 C) 98 F (36.7 C)  TempSrc:   Oral Oral  SpO2: 93% 96% 99% 99%  Weight:   103.6 kg (228 lb 8 oz)   Height:   6\' 3"  (1.905 m)     General: Pt is alert, awake, not in acute distress Cardiovascular: RRR, S1/S2 +, no rubs, no gallops Respiratory: CTA bilaterally, no wheezing, no rhonchi Abdominal: Soft, NT, ND, bowel sounds + Extremities: no edema, no cyanosis   The results of significant diagnostics from this hospitalization (including imaging, microbiology, ancillary and laboratory) are listed below for reference.     Microbiology: No results found for this or any previous visit (from the past 240 hour(s)).   Labs: BNP (last 3 results) No results for input(s): BNP in the last 8760 hours. Basic Metabolic Panel:  Recent Labs Lab 12/04/16 1810  NA 140  K 3.9  CL 108  CO2 22  GLUCOSE 93  BUN 11  CREATININE 0.86  CALCIUM 9.1   Liver Function Tests: No results for  input(s): AST, ALT, ALKPHOS, BILITOT, PROT, ALBUMIN in the last 168 hours. No results for input(s): LIPASE, AMYLASE in the last 168 hours. No results for input(s): AMMONIA in the last 168 hours. CBC:  Recent Labs Lab 12/04/16 1810  WBC 7.8  HGB 15.3  HCT 44.3  MCV 81.4  PLT 201   Cardiac Enzymes:  Recent Labs Lab 12/05/16 0400 12/05/16 0809  TROPONINI <0.03 <0.03   BNP: Invalid input(s): POCBNP CBG: No results for input(s): GLUCAP in the last 168 hours. D-Dimer  Recent Labs  12/05/16 0021  DDIMER 0.28   Hgb A1c  Recent Labs  12/05/16 0400  HGBA1C 5.2   Lipid Profile  Recent Labs  12/05/16 0400  CHOL 205*  HDL 44  LDLCALC 129*  TRIG 161*  CHOLHDL 4.7   Thyroid function studies No results for input(s): TSH, T4TOTAL, T3FREE, THYROIDAB in the last 72 hours.  Invalid input(s): FREET3 Anemia work up No results for input(s): VITAMINB12, FOLATE, FERRITIN, TIBC, IRON, RETICCTPCT in the last 72 hours. Urinalysis No results found for: COLORURINE, APPEARANCEUR, LABSPEC, PHURINE, GLUCOSEU, HGBUR, BILIRUBINUR, KETONESUR, PROTEINUR, UROBILINOGEN, NITRITE, LEUKOCYTESUR Sepsis Labs Invalid input(s): PROCALCITONIN,  WBC,  LACTICIDVEN Microbiology No results found for this or any previous visit (from the past 240 hour(s)).  Time coordinating discharge:   SIGNED:  Standley Dakins, Dalton  [facility] 12/05/2016, 4:02 PM Pager [phone]  If 7PM-7AM, please contact night-coverage [email or web address] Password [email or web address]

## 2016-12-05 NOTE — Progress Notes (Signed)
Personally reviewed echo and treadmill stress test. No ischemic EKG changes with exercise. Echo is unremarkable - LV filing pressure reported to be high, but it is normal. Etiology of his hypotension is unclear, but given negative work-up and negative troponins, can be safely discharged. I'm happy to see back in the office in follow-up for risk factor modification.  Chrystie Nose, MD, [facility]  [place]  [facility]  Attending Cardiologist  Direct Dial: [phone]  Fax: [phone]  Website:  [email or web address]

## 2016-12-05 NOTE — ED Notes (Signed)
Attempted to call report

## 2016-12-05 NOTE — H&P (Addendum)
History and Physical    Taylor Dalton WLK:[id number] DOB: 02/21/1976 DOA: 12/04/2016  Referring MD/NP/PA:   PCP: Taylor Sams, DO   Patient coming from:  The patient is coming from home.  At baseline, pt is independent for most of ADL.   Chief Complaint: Chest pain  HPI: Taylor Dalton is a 41 y.o. male with medical history significant of obesity, hyperlipidemia, OSA not on CPAP, who presents with chest pain.  Patient states that his chest pain started in this afternoon when he was at working. It is located in substernal area and also in left lower chest, constant, pressure-like, 4 out of 10 in severity, radiating to the left arm. No shortness of breath, cough, fever or chills. No tenderness in the calf areas. No recent long distant traveling. Patient has nausea, no vomiting, diarrhea, abdominal pain, symptoms of UTI or unilateral weakness. Pt states that his father had heart attack at 82s without significant medical issues at that time.  ED Course: pt was found to have negative d-dimer, negative troponin, WBC 7.8, electrolytes renal function okay, temperature normal, soft blood pressure, slightly tachycardia, O2 sat 93% on room air. Chest x-ray negative. Patient is placed on telemetry bed for observation.  Review of Systems:   General: no fevers, chills, no body weight gain, has fatigue HEENT: no blurry vision, hearing changes or sore throat Respiratory: no dyspnea, coughing, wheezing CV: has chest pain, no palpitations GI: no nausea, vomiting, abdominal pain, diarrhea, constipation GU: no dysuria, burning on urination, increased urinary frequency, hematuria  Ext: no leg edema Neuro: no unilateral weakness, numbness, or tingling, no vision change or hearing loss Skin: no rash, no skin tear. MSK: No muscle spasm, no deformity, no limitation of range of movement in spin Heme: No easy bruising.  Travel history: No recent long distant travel.  Allergy: No Known Allergies  Past  Medical History:  Diagnosis Date  . Chicken pox   . Headache     Past Surgical History:  Procedure Laterality Date  . KNEE SURGERY Right     Social History:  reports that he quit smoking about 4 years ago. His smoking use included Cigarettes. He smoked 1.00 pack per day. He has never used smokeless tobacco. He reports that he drinks about 1.2 - 1.8 oz of alcohol per week . He reports that he does not use drugs.  Family History:  Family History  Problem Relation Age of Onset  . Prostate cancer Paternal Grandmother   . Heart disease Father   . Cancer Brother      Prior to Admission medications   Medication Sig Start Date End Date Taking? Authorizing Provider  cyclobenzaprine (FLEXERIL) 10 MG tablet Take 1 tablet (10 mg total) by mouth at bedtime as needed for muscle spasms. 10/03/16   Taylor Sams, DO  Liraglutide -Weight Management (SAXENDA) 18 MG/3ML SOPN Inject 3 mg into the skin daily. 10/24/16   Taylor Sams, DO    Physical Exam: Vitals:   12/05/16 0130 12/05/16 0145 12/05/16 0200 12/05/16 0215  BP: 113/76 114/73 91/62 105/82  Pulse: 76 60 95   Resp: 18 18 17 18   Temp:      TempSrc:      SpO2: 96% 97% 93% 93%   General: Not in acute distress HEENT:       Eyes: PERRL, EOMI, no scleral icterus.       ENT: No discharge from the ears and nose, no pharynx injection, no tonsillar enlargement.  Neck: No JVD, no bruit, no mass felt. Heme: No neck lymph node enlargement. Cardiac: S1/S2, RRR, No murmurs, No gallops or rubs. Respiratory:  No rales, wheezing, rhonchi or rubs. GI: Soft, nondistended, nontender, no rebound pain, no organomegaly, BS present. GU: No hematuria Ext: No pitting leg edema bilaterally. 2+DP/PT pulse bilaterally. Musculoskeletal: No joint deformities, No joint redness or warmth, no limitation of ROM in spin. Skin: No rashes.  Neuro: Alert, oriented X3, cranial nerves II-XII grossly intact, moves all extremities normally.  Psych: Patient is not  psychotic, no suicidal or hemocidal ideation.  Labs on Admission: I have personally reviewed following labs and imaging studies  CBC:  Recent Labs Lab 12/04/16 1810  WBC 7.8  HGB 15.3  HCT 44.3  MCV 81.4  PLT 201   Basic Metabolic Panel:  Recent Labs Lab 12/04/16 1810  NA 140  K 3.9  CL 108  CO2 22  GLUCOSE 93  BUN 11  CREATININE 0.86  CALCIUM 9.1   GFR: CrCl cannot be calculated (Unknown ideal weight.). Liver Function Tests: No results for input(s): AST, ALT, ALKPHOS, BILITOT, PROT, ALBUMIN in the last 168 hours. No results for input(s): LIPASE, AMYLASE in the last 168 hours. No results for input(s): AMMONIA in the last 168 hours. Coagulation Profile: No results for input(s): INR, PROTIME in the last 168 hours. Cardiac Enzymes: No results for input(s): CKTOTAL, CKMB, CKMBINDEX, TROPONINI in the last 168 hours. BNP (last 3 results) No results for input(s): PROBNP in the last 8760 hours. HbA1C: No results for input(s): HGBA1C in the last 72 hours. CBG: No results for input(s): GLUCAP in the last 168 hours. Lipid Profile: No results for input(s): CHOL, HDL, LDLCALC, TRIG, CHOLHDL, LDLDIRECT in the last 72 hours. Thyroid Function Tests: No results for input(s): TSH, T4TOTAL, FREET4, T3FREE, THYROIDAB in the last 72 hours. Anemia Panel: No results for input(s): VITAMINB12, FOLATE, FERRITIN, TIBC, IRON, RETICCTPCT in the last 72 hours. Urine analysis: No results found for: COLORURINE, APPEARANCEUR, LABSPEC, PHURINE, GLUCOSEU, HGBUR, BILIRUBINUR, KETONESUR, PROTEINUR, UROBILINOGEN, NITRITE, LEUKOCYTESUR Sepsis Labs: @LABRCNTIP (procalcitonin:4,lacticidven:4) )No results found for this or any previous visit (from the past 240 hour(s)).   Radiological Exams on Admission: Dg Chest 2 View  Result Date: 12/04/2016 CLINICAL DATA:  Left-sided chest pain. EXAM: CHEST  2 VIEW COMPARISON:  None. FINDINGS: The heart size and mediastinal contours are within normal limits.  Both lungs are clear. The visualized skeletal structures are unremarkable. IMPRESSION: No active cardiopulmonary disease. Electronically Signed   By: Gerome Sam III M.D   On: 12/04/2016 18:56    EKG: Independently reviewed.  Sinus rhythm, QTC 417, s1q3t3 Assessment/Plan Principal Problem:   Chest pain Active Problems:   Obesity (BMI 30.0-34.9)   Hyperlipidemia   Chest pain: Etiology is not clear. D-dimer is negative, less likely to have PE. Chest x-ray negative for pneumonia. Patient's father had premature heart attack at 34s. His risk factors include hyperlipidemia, OSA and obesity. Will do chest pain rule out work up.   - will place on Tele bed for obs - cycle CE q6 x3 and repeat EKG in the am  - Nitroglycerin, Morphine, and aspirin - Risk factor stratification: will check FLP,UDS and A1C  - 2d echo - Inpatient non-urgent consult order was put in Epic and message to Daun Peacock was sent out.  Obesity (BMI 30.0-34.9): -on Saxenda-->pharm dose not carry this medicaiton  HLD: Last LDL was 140 on 06/05/16. Not on medications. -f/u FLP  DVT ppx: SQ Lovenox Code Status:  Full code Family Communication:  Yes, patient's wife at bed side Disposition Plan:  Anticipate discharge back to previous home environment Consults called:  none Admission status: Obs / tele    Date of Service 12/05/2016    Lorretta Harp [facility] Pager [phone]  If 7PM-7AM, please contact night-coverage [email or web address] Password [email or web address] 12/05/2016, 2:35 AM

## 2016-12-05 NOTE — Progress Notes (Signed)
  Echocardiogram 2D Echocardiogram has been performed.  Taylor Dalton 12/05/2016, 9:37 AM

## 2016-12-05 NOTE — ED Provider Notes (Signed)
[facility]-EMERGENCY DEPT Provider Note   CSN: [id number] Arrival date & time: 12/04/16  1755  By signing my name below, I, Diona Browner, attest that this documentation has been prepared under the direction and in the presence of Derwood Kaplan, MD. Electronically Signed: Diona Browner, ED Scribe. 12/05/16. 12:23 AM.  History   Chief Complaint Chief Complaint  Patient presents with  . Chest Pain    HPI Taylor Dalton is a 41 y.o. male with a PMHx of HLD, who presents to the Emergency Department complaining of a constant, chest tightness and pressure that started today. Pt was at work when his sx started and went to see the nurse at his job. The nurse noticed he had low BP and advised him to come to the ED. Associated sx include dizziness, nausea for last few days, diaphoresis, HA, and stabbing pain in his left arm. Breathing and walking exacerbates his chest pain. Never felt like he was going to faint, just felt unsteady. Notes he feels like he can't take a deep breath. He currently rates his pain a 3/10 severity. Had similar sx ~ 1 year ago and had blood work and an EKG with normal results. No hx of DM, HTN, cancer or blood clots. Smokes 4-5 cigarettes per day. No drug use. Takes saxenda daily. No Testerone use. No recent long travel. No recent surgery. Pt notes his father died at 26 due to a sudden heart attack. Pt denies cough, fever, vomiting or any other complaints at this time.   Pt also c/o of a rash to his chest that was noticed a couple months ago.   The history is provided by the patient and the spouse. No language interpreter was used.    Past Medical History:  Diagnosis Date  . Chicken pox   . Headache     Patient Active Problem List   Diagnosis Date Noted  . Unstable angina (HCC)   . Hyperlipidemia 10/03/2016  . Muscle spasm 10/03/2016  . Obesity (BMI 30.0-34.9) 01/15/2015  . OSA (obstructive sleep apnea) 01/15/2015  . Chest pain 01/15/2015    Past Surgical History:   Procedure Laterality Date  . KNEE SURGERY Right        Home Medications    Prior to Admission medications   Medication Sig Start Date End Date Taking? Authorizing Provider  Aspirin-Salicylamide-Caffeine (BC HEADACHE POWDER PO) Take 1 Package by mouth daily as needed (pain).   Yes [provider]  cyclobenzaprine (FLEXERIL) 10 MG tablet Take 1 tablet (10 mg total) by mouth at bedtime as needed for muscle spasms. 10/03/16  Yes Cook, Jayce G, DO  Liraglutide -Weight Management (SAXENDA) 18 MG/3ML SOPN Inject 3 mg into the skin daily. 10/24/16  Yes Tommie Sams, DO    Family History Family History  Problem Relation Age of Onset  . Prostate cancer Paternal Grandmother   . Heart disease Father   . Cancer Brother     Social History Social History  Substance Use Topics  . Smoking status: Former Smoker    Packs/day: 1.00    Types: Cigarettes    Quit date: 01/15/2012  . Smokeless tobacco: Never Used  . Alcohol use 1.2 - 1.8 oz/week    2 - 3 Standard drinks or equivalent per week     Allergies   Patient has no known allergies.   Review of Systems Review of Systems  Constitutional: Positive for diaphoresis. Negative for fever.  Respiratory: Negative for cough.   Cardiovascular: Positive for chest pain.  Gastrointestinal: Positive for nausea. Negative for vomiting.  Musculoskeletal: Positive for arthralgias.  Neurological: Positive for dizziness and headaches.  All other systems reviewed and are negative.    Physical Exam Updated Vital Signs BP 106/61   Pulse (!) 59   Temp 98 F (36.7 C) (Oral)   Resp 15   Ht 6\' 3"  (1.905 m)   Wt 103.6 kg (228 lb 8 oz)   SpO2 99%   BMI 28.56 kg/m   Physical Exam  Constitutional: He is oriented to person, place, and time. He appears well-developed and well-nourished.  HENT:  Head: Normocephalic.  Eyes: EOM are normal.  Neck: Normal range of motion. No JVD present.  Cardiovascular: Normal rate, regular rhythm and  normal heart sounds.   Pulmonary/Chest: Effort normal and breath sounds normal.  Lungs clear to ascultation.   Abdominal: Soft. Bowel sounds are normal. He exhibits no distension.  Musculoskeletal: Normal range of motion. He exhibits no edema.  No pitting edema. No unilateral calf swelling.   Neurological: He is alert and oriented to person, place, and time.  Psychiatric: He has a normal mood and affect.  Nursing note and vitals reviewed.    ED Treatments / Results  DIAGNOSTIC STUDIES: Oxygen Saturation is 98% on RA, normal by my interpretation.   COORDINATION OF CARE: 12:23 AM-Discussed next steps with pt. Pt verbalized understanding and is agreeable with the plan.   Labs (all labs ordered are listed, but only abnormal results are displayed) Labs Reviewed  LIPID PANEL - Abnormal; Notable for the following:       Result Value   Cholesterol 205 (*)    Triglycerides 161 (*)    LDL Cholesterol 129 (*)    All other components within normal limits  BASIC METABOLIC PANEL  CBC  D-DIMER, QUANTITATIVE (NOT AT [facility])  RAPID URINE DRUG SCREEN, HOSP PERFORMED  HEMOGLOBIN A1C  TROPONIN I  TROPONIN I  TROPONIN I  HIV ANTIBODY (ROUTINE TESTING)  I-STAT TROPONIN, ED  I-STAT TROPONIN, ED    EKG  EKG Interpretation  Date/Time:  Thursday December 04 2016 17:54:45 EDT Ventricular Rate:  94 PR Interval:  146 QRS Duration: 84 QT Interval:  334 QTC Calculation: 417 R Axis:   41 Text Interpretation:  Normal sinus rhythm Normal ECG s1q3t3 pattern is new No acute changes Confirmed by Derwood Kaplan [phone]) on 12/05/2016 12:16:31 AM Also confirmed by Derwood Kaplan [phone]), editor Elita Quick ([id number])  on 12/05/2016 7:15:21 AM       Radiology Dg Chest 2 View  Result Date: 12/04/2016 CLINICAL DATA:  Left-sided chest pain. EXAM: CHEST  2 VIEW COMPARISON:  None. FINDINGS: The heart size and mediastinal contours are within normal limits. Both lungs are clear. The visualized  skeletal structures are unremarkable. IMPRESSION: No active cardiopulmonary disease. Electronically Signed   By: Gerome Sam III M.D   On: 12/04/2016 18:56    Procedures Procedures (including critical care time)  Medications Ordered in ED Medications  acetaminophen (TYLENOL) tablet 650 mg (650 mg Oral Given 12/05/16 0034)  aspirin chewable tablet 324 mg (324 mg Oral Given 12/05/16 0201)     Initial Impression / Assessment and Plan / ED Course  I have reviewed the triage vital signs and the nursing notes.  Pertinent labs & imaging results that were available during my care of the patient were reviewed by me and considered in my medical decision making (see chart for details).     Differential diagnosis includes: ACS syndrome Valvular disorder Myocarditis Pericarditis  Endocarditis Pericardial effusion / tamponade Pneumonia Pleural effusion / Pulmonary edema PE Pneumothorax Musculoskeletal pain  Pt comes in with cc of chest heaviness that started today with associated shoulder discomfort intermittently, dyspnea and diaphoresis. Pt reports that his father had a sudden cardiac arrest despite being completely healthy when he was 84. HEART score is 2 - for hx and risk factors. Dimer ordered. If dimer neg, admit for chest pain r/o mainly based on strong family hx and a good history.  NOW: Chest pain improved post nitro.    Final Clinical Impressions(s) / ED Diagnoses   Final diagnoses:  Unstable angina [facility])    New Prescriptions Discharge Medication List as of 12/05/2016  4:27 PM         Derwood Kaplan, MD 12/06/16 0010

## 2016-12-05 NOTE — Consult Note (Signed)
Cardiology Consultation:   Patient ID: Taylor Dalton; [id number]; 24-Jul-1975   Admit date: 12/04/2016 Date of Consult: 12/05/2016  Primary Care Provider: Tommie Sams, DO Primary Cardiologist: new -  Primary Electrophysiologist:     Patient Profile:   Taylor Dalton is a 41 y.o. male with a hx of HLD, OSA, and obesity who is being seen today for the evaluation of chest pain at the request of Dr. Laural Benes.  History of Present Illness:   [name] presented to [facility] with sudden onset chest pain located in the substernal area and rated as a 5-6/10. The pain radiated to his left arm, was constant, and felt like a pressure. The pain in his arm felt like a tingling sensation. 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 He presented to the corporate nurse at work who took his BP and found that he was hypotensive in the sBP 80s. She sent him to [facility]. EMS gave him 2 nitro tablets which relieved his pain. He states that he has not felt well for the past 2 days, feeling fatigued. He had shortness of breath and mild nausea. He denied previous chest pain, palpitations, and diaphoresis. On my exam, his pain is a 2/10.  He expresses concern that his father died of heart disease at age 66 and was otherwise healthy.    Past Medical History:  Diagnosis Date  . Chicken pox   . Headache     Past Surgical History:  Procedure Laterality Date  . KNEE SURGERY Right      Home Medications:  Prior to Admission medications   Medication Sig Start Date End Date Taking? Authorizing Provider  Aspirin-Salicylamide-Caffeine (BC HEADACHE POWDER PO) Take 1 Package by mouth daily as needed (pain).   Yes [provider]  cyclobenzaprine (FLEXERIL) 10 MG tablet Take 1 tablet (10 mg total) by mouth at bedtime as needed for muscle spasms. 10/03/16  Yes Cook, Jayce G, DO  Liraglutide -Weight Management (SAXENDA) 18 MG/3ML SOPN Inject 3 mg into the skin daily. 10/24/16  Yes  Taylor Sams, DO    Inpatient Medications: Scheduled Meds: . aspirin  324 mg Oral Daily  . enoxaparin (LOVENOX) injection  40 mg Subcutaneous Daily   Continuous Infusions: . sodium chloride 125 mL/hr at 12/05/16 0349   PRN Meds: acetaminophen, cyclobenzaprine, morphine injection, nitroGLYCERIN, ondansetron (ZOFRAN) IV, zolpidem  Allergies:   No Known Allergies  Social History:   Social History   Social History  . Marital status: Married    Spouse name: N/A  . Number of children: N/A  . Years of education: N/A   Occupational History  . Not on file.   Social History Main Topics  . Smoking status: Former Smoker    Packs/day: 1.00    Types: Cigarettes    Quit date: 01/15/2012  . Smokeless tobacco: Never Used  . Alcohol use 1.2 - 1.8 oz/week    2 - 3 Standard drinks or equivalent per week  . Drug use: No  . Sexual activity: Yes   Other Topics Concern  . Not on file   Social History Narrative  . No narrative on file    Family History:    Family History  Problem Relation Age of Onset  . Prostate cancer Paternal Grandmother   . Heart disease Father   . Cancer Brother      ROS:  Please see the history of present illness.  ROS  All other  ROS reviewed and negative.     Physical Exam/Data:   Vitals:   12/05/16 0330 12/05/16 0400 12/05/16 0500 12/05/16 0626  BP: 109/70 110/70 95/63 104/62  Pulse: 61 (!) 59 (!) 54 (!) 55  Resp: 15 16 14 16   Temp:    97.6 F (36.4 C)  TempSrc:    Oral  SpO2: 97% 93% 96% 99%  Weight:    228 lb 8 oz (103.6 kg)  Height:    6\' 3"  (1.905 m)   No intake or output data in the 24 hours ending 12/05/16 0740 Filed Weights   12/05/16 0626  Weight: 228 lb 8 oz (103.6 kg)   Body mass index is 28.56 kg/m.  General:  Well nourished, well developed, in no acute distress HEENT: normal Lymph: no adenopathy Neck: no JVD Endocrine:  No thryomegaly Vascular: No carotid bruits; FA pulses 2+ bilaterally without bruits  Cardiac:  normal  S1, S2; RRR; no murmur  Lungs:  clear to auscultation bilaterally, no wheezing, rhonchi or rales  Abd: soft, nontender, no hepatomegaly  Ext: no edema Musculoskeletal:  No deformities, BUE and BLE strength normal and equal Skin: warm and dry  Psych:  Normal affect   EKG:  The EKG was personally reviewed and demonstrates:  Sinus bradycardia Telemetry:  Telemetry was personally reviewed and demonstrates:  Sinus rhythm  Relevant CV Studies:  -none  Laboratory Data:  Chemistry Recent Labs Lab 12/04/16 1810  NA 140  K 3.9  CL 108  CO2 22  GLUCOSE 93  BUN 11  CREATININE 0.86  CALCIUM 9.1  GFRNONAA >60  GFRAA >60  ANIONGAP 10    No results for input(s): PROT, ALBUMIN, AST, ALT, ALKPHOS, BILITOT in the last 168 hours. Hematology Recent Labs Lab 12/04/16 1810  WBC 7.8  RBC 5.44  HGB 15.3  HCT 44.3  MCV 81.4  MCH 28.1  MCHC 34.5  RDW 13.0  PLT 201   Cardiac Enzymes Recent Labs Lab 12/05/16 0400  TROPONINI <0.03    Recent Labs Lab 12/04/16 1835 12/05/16 0055  TROPIPOC 0.00 0.00    BNPNo results for input(s): BNP, PROBNP in the last 168 hours.  DDimer  Recent Labs Lab 12/05/16 0021  DDIMER 0.28    Radiology/Studies:  Dg Chest 2 View  Result Date: 12/04/2016 CLINICAL DATA:  Left-sided chest pain. EXAM: CHEST  2 VIEW COMPARISON:  None. FINDINGS: The heart size and mediastinal contours are within normal limits. Both lungs are clear. The visualized skeletal structures are unremarkable. IMPRESSION: No active cardiopulmonary disease. Electronically Signed   By: Gerome Sam III M.D   On: 12/04/2016 18:56    Assessment and Plan:   1. Chest pain - troponin x 3 negative - EKG without signs of ischmeia - negative D-Dimer Risk factors for ACS include HLD, OSA (did not qualify for CPAP), and family history (father died of heart disease at age 35). His pain sounds typical in nature even though his troponin was negative ad EKG without signs of ischemia. We  will perform exercise myoview and obtain echocardiogram.   2. OSA - he underwent sleep study and did not qualify for a split night study - no CPAP   3. HLD - total cholesterol 205, triglycerides 161, LDL 129 - lipid panel has improved with weight loss - he is currently not on a statin  .   Signed, Roe Rutherford Raymundo Rout, PA  12/05/2016 7:40 AM

## 2016-12-05 NOTE — Discharge Instructions (Signed)
Follow with Primary MD  Tommie Sams, DO  and other consultant's as instructed your Hospitalist MD  Please get a complete blood count and chemistry panel checked by your Primary MD at your next visit, and again as instructed by your Primary MD.  Get Medicines reviewed and adjusted: Please take all your medications with you for your next visit with your Primary MD  Laboratory/radiological data: Please request your Primary MD to go over all hospital tests and procedure/radiological results at the follow up, please ask your Primary MD to get all Hospital records sent to his/her office.  In some cases, they will be blood work, cultures and biopsy results pending at the time of your discharge. Please request that your primary care M.D. follows up on these results.  Also Note the following: If you experience worsening of your admission symptoms, develop shortness of breath, life threatening emergency, suicidal or homicidal thoughts you must seek medical attention immediately by calling 911 or calling your MD immediately  if symptoms less severe.  You must read complete instructions/literature along with all the possible adverse reactions/side effects for all the Medicines you take and that have been prescribed to you. Take any new Medicines after you have completely understood and accpet all the possible adverse reactions/side effects.   Do not drive when taking Pain medications or sleeping medications (Benzodaizepines)  Do not take more than prescribed Pain, Sleep and Anxiety Medications. It is not advisable to combine anxiety,sleep and pain medications without talking with your primary care practitioner  Special Instructions: If you have smoked or chewed Tobacco  in the last 2 yrs please stop smoking, stop any regular Alcohol  and or any Recreational drug use.  Wear Seat belts while driving.  Please note: You were cared for by a hospitalist during your hospital stay. Once you are discharged,  your primary care physician will handle any further medical issues. Please note that NO REFILLS for any discharge medications will be authorized once you are discharged, as it is imperative that you return to your primary care physician (or establish a relationship with a primary care physician if you do not have one) for your post hospital discharge needs so that they can reassess your need for medications and monitor your lab values.

## 2016-12-18 ENCOUNTER — Encounter: Payer: Self-pay | Admitting: Internal Medicine

## 2016-12-18 ENCOUNTER — Ambulatory Visit (INDEPENDENT_AMBULATORY_CARE_PROVIDER_SITE_OTHER): Payer: Commercial Managed Care - PPO | Admitting: Internal Medicine

## 2016-12-18 VITALS — BP 105/70 | HR 74 | Ht 75.0 in | Wt 227.8 lb

## 2016-12-18 DIAGNOSIS — R0789 Other chest pain: Secondary | ICD-10-CM

## 2016-12-18 NOTE — Patient Instructions (Signed)
Your physician recommends that you schedule a follow-up appointment as needed with Dr. Hilty.  

## 2016-12-20 NOTE — Progress Notes (Signed)
OFFICE FOLLOW-UP NOTE  Chief Complaint:  Hospital follow-up  Primary Care Physician: Taylor Dalton, Taylor G, DO  HPI:  Taylor Dalton is a 41 y.o. [place] male who was formerly in the [employer] and works at [employer], with a past medial history significant for very few if any medical problems. Recently he was working and felt queasy for several days and had some chest discomfort. He saw his company nurse and was found to be hypotensive with blood pressures in the 80s. He was sent to the emergency department and received 2 nitroglycerin. He said this relieved his pain. Subsequently he felt fairly well. He does have a history of heart disease in his father who died unexpectedly in his late 4050s from an MI. He does have a history of dyslipidemia with an LDL of 129. Otherwise no significant medical problems. Hospitalized he underwent an exercise treadmill stress test which was negative for ischemia. I personally reviewed this study. He also had an echocardiogram as some of his symptoms were worse with position and I wish to rule out any pericarditis. The echo demonstrated EF 55-60% with normal wall motion and normal diastolic function. He ruled out for MI with serial troponins. Subsequently he was discharged. Since discharge she reports she still gets some feelings of chest discomfort and queasiness. He at times seems that this is worse when he is under stress or moving or lifting items. This makes me suspicious that it may be related to a chest wall pain or perhaps a radiculopathy. He does occasionally get neck and shoulder pain.  PMHx:  Past Medical History:  Diagnosis Date  . Chicken pox   . Headache     Past Surgical History:  Procedure Laterality Date  . KNEE SURGERY Right     FAMHx:  Family History  Problem Relation Age of Onset  . Prostate cancer Paternal Grandmother   . Heart disease Father   . Cancer Brother     SOCHx:   reports that he quit smoking about 4 years ago. His  smoking use included Cigarettes. He smoked 1.00 pack per day. He has never used smokeless tobacco. He reports that he drinks about 1.2 - 1.8 oz of alcohol per week . He reports that he does not use drugs.  ALLERGIES:  No Known Allergies  ROS: Pertinent items noted in HPI and remainder of comprehensive ROS otherwise negative.  HOME MEDS: Current Outpatient Prescriptions on File Prior to Visit  Medication Sig Dispense Refill  . Aspirin-Salicylamide-Caffeine (BC HEADACHE POWDER PO) Take 1 Package by mouth daily as needed (pain).    . cyclobenzaprine (FLEXERIL) 10 MG tablet Take 1 tablet (10 mg total) by mouth at bedtime as needed for muscle spasms. 30 tablet 0  . Liraglutide -Weight Management (SAXENDA) 18 MG/3ML SOPN Inject 3 mg into the skin daily. 15 pen 3   No current facility-administered medications on file prior to visit.     LABS/IMAGING: No results found for this or any previous visit (from the past 48 hour(s)). No results found.  LIPID PANEL:    Component Value Date/Time   CHOL 205 (H) 12/05/2016 0400   TRIG 161 (H) 12/05/2016 0400   HDL 44 12/05/2016 0400   CHOLHDL 4.7 12/05/2016 0400   VLDL 32 12/05/2016 0400   LDLCALC 129 (H) 12/05/2016 0400     WEIGHTS: Wt Readings from Last 3 Encounters:  12/18/16 227 lb 12.8 oz (103.3 kg)  12/05/16 228 lb 8 oz (103.6 kg)  10/03/16 233 lb (  105.7 kg)    VITALS: BP 105/70   Pulse 74   Ht 6\' 3"  (1.905 m)   Wt 227 lb 12.8 oz (103.3 kg)   SpO2 97%   BMI 28.47 kg/m   EXAM: General appearance: alert and no distress Neck: no carotid bruit, no JVD and thyroid not enlarged, symmetric, no tenderness/mass/nodules Lungs: clear to auscultation bilaterally Heart: regular rate and rhythm, S1, S2 normal, no murmur, click, rub or gallop Abdomen: soft, non-tender; bowel sounds normal; no masses,  no organomegaly Extremities: extremities normal, atraumatic, no cyanosis or edema Pulses: 2+ and symmetric Skin: Skin color, texture,  turgor normal. No rashes or lesions Neurologic: Grossly normal Psych: Pleasant  EKG: Deferred - personally reviewed  ASSESSMENT: 1. Atypical chest pain 2. Mild dyspnea 3. Low risk exercise treadmill stress test 4. Normal LV systolic and diastolic function on echo  PLAN: 1.   Mr. Taylor Dalton is having an unusual pain that seems to be worse with exertion and stress. He feels somewhat queasy. Workup so far is been negative. It's unlikely that the studies have missed coronary artery disease although he has a significant family history. Some of his symptoms could be related to reflux or perhaps a musculoskeletal chest pain component. He also gets some shortness of breath with exertion. I encouraged follow-up with his primary care provider and I'm certainly happy to see him back if his symptoms worsen. We may need to consider a more sensitive stress test or perhaps catheterization if he persistently has symptoms.  Chrystie Nose, MD, [facility]  [place]  [facility]  Attending Cardiologist  Direct Dial: [phone]  Fax: [phone]  Website:  [email or web address] 12/20/2016, 4:12 PM

## 2019-04-28 ENCOUNTER — Ambulatory Visit: Payer: 59 | Attending: Internal Medicine

## 2019-04-28 DIAGNOSIS — Z20822 Contact with and (suspected) exposure to covid-19: Secondary | ICD-10-CM | POA: Insufficient documentation

## 2019-04-30 LAB — NOVEL CORONAVIRUS, NAA: SARS-CoV-2, NAA: NOT DETECTED

## 2019-11-25 ENCOUNTER — Other Ambulatory Visit: Payer: Self-pay | Admitting: Physician Assistant

## 2019-11-25 DIAGNOSIS — M25511 Pain in right shoulder: Secondary | ICD-10-CM

## 2019-11-29 ENCOUNTER — Other Ambulatory Visit: Payer: Self-pay

## 2019-11-29 ENCOUNTER — Encounter: Payer: Self-pay | Admitting: Nurse Practitioner

## 2019-11-29 ENCOUNTER — Ambulatory Visit: Payer: 59 | Admitting: Nurse Practitioner

## 2019-11-29 VITALS — BP 106/64 | HR 90 | Temp 98.1°F | Ht 75.0 in | Wt 260.0 lb

## 2019-11-29 DIAGNOSIS — E785 Hyperlipidemia, unspecified: Secondary | ICD-10-CM

## 2019-11-29 DIAGNOSIS — F17290 Nicotine dependence, other tobacco product, uncomplicated: Secondary | ICD-10-CM

## 2019-11-29 DIAGNOSIS — F17210 Nicotine dependence, cigarettes, uncomplicated: Secondary | ICD-10-CM

## 2019-11-29 DIAGNOSIS — E669 Obesity, unspecified: Secondary | ICD-10-CM

## 2019-11-29 DIAGNOSIS — Z6832 Body mass index (BMI) 32.0-32.9, adult: Secondary | ICD-10-CM

## 2019-11-29 DIAGNOSIS — Z0000 Encounter for general adult medical examination without abnormal findings: Secondary | ICD-10-CM | POA: Diagnosis not present

## 2019-11-29 DIAGNOSIS — F17200 Nicotine dependence, unspecified, uncomplicated: Secondary | ICD-10-CM

## 2019-11-29 LAB — COMPREHENSIVE METABOLIC PANEL
ALT: 27 U/L (ref 0–53)
AST: 18 U/L (ref 0–37)
Albumin: 4.1 g/dL (ref 3.5–5.2)
Alkaline Phosphatase: 71 U/L (ref 39–117)
BUN: 13 mg/dL (ref 6–23)
CO2: 27 mEq/L (ref 19–32)
Calcium: 9 mg/dL (ref 8.4–10.5)
Chloride: 105 mEq/L (ref 96–112)
Creatinine, Ser: 0.97 mg/dL (ref 0.40–1.50)
GFR: 83.75 mL/min (ref >60.00)
Glucose, Bld: 96 mg/dL (ref 70–99)
Potassium: 3.9 mEq/L (ref 3.5–5.1)
Sodium: 140 mEq/L (ref 135–145)
Total Bilirubin: 0.4 mg/dL (ref 0.2–1.2)
Total Protein: 6.4 g/dL (ref 6.0–8.3)

## 2019-11-29 LAB — CBC WITH DIFFERENTIAL/PLATELET
Basophils Absolute: 0 10*3/uL (ref 0.0–0.1)
Basophils Relative: 0.3 % (ref 0.0–3.0)
Eosinophils Absolute: 0.1 10*3/uL (ref 0.0–0.7)
Eosinophils Relative: 1.6 % (ref 0.0–5.0)
HCT: 42.7 % (ref 39.0–52.0)
Hemoglobin: 14.4 g/dL (ref 13.0–17.0)
Lymphocytes Relative: 25.8 % (ref 12.0–46.0)
Lymphs Abs: 2.2 10*3/uL (ref 0.7–4.0)
MCHC: 33.6 g/dL (ref 30.0–36.0)
MCV: 83.6 fl (ref 78.0–100.0)
Monocytes Absolute: 0.9 10*3/uL (ref 0.1–1.0)
Monocytes Relative: 10.9 % (ref 3.0–12.0)
Neutro Abs: 5.3 10*3/uL (ref 1.4–7.7)
Neutrophils Relative %: 61.4 % (ref 43.0–77.0)
Platelets: 216 10*3/uL (ref 150.0–400.0)
RBC: 5.1 Mil/uL (ref 4.22–5.81)
RDW: 13.8 % (ref 11.5–15.5)
WBC: 8.6 10*3/uL (ref 4.0–10.5)

## 2019-11-29 LAB — LIPID PANEL
Cholesterol: 231 mg/dL — ABNORMAL HIGH (ref 0–200)
HDL: 43.5 mg/dL (ref >39.00)
NonHDL: 187.88
Total CHOL/HDL Ratio: 5
Triglycerides: 216 mg/dL — ABNORMAL HIGH (ref 0.0–149.0)
VLDL: 43.2 mg/dL — ABNORMAL HIGH (ref 0.0–40.0)

## 2019-11-29 LAB — LDL CHOLESTEROL, DIRECT: Direct LDL: 152 mg/dL

## 2019-11-29 LAB — B12 AND FOLATE PANEL
Folate: 12.1 ng/mL (ref >5.9)
Vitamin B-12: 273 pg/mL (ref 211–911)

## 2019-11-29 LAB — HEMOGLOBIN A1C: Hgb A1c MFr Bld: 5.8 % (ref 4.6–6.5)

## 2019-11-29 LAB — TSH: TSH: 1.86 u[IU]/mL (ref 0.35–4.50)
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
## 2019-11-29 NOTE — Patient Instructions (Addendum)
Please go to the lab  today.   Please follow-up at your convenience for complete physical exam in the next month.     Here is information below about smoking cessation.   When you get ready to quit or just need more information- please call telephone quitlines (1-800-QUIT-NOW), reach out to support groups, or work with a Veterinary surgeon for support.        Steps to Quit Smoking Smoking tobacco is the leading cause of preventable death. It can affect almost every organ in the body. Smoking puts you and those around you at risk for developing many serious chronic diseases. Quitting smoking can be difficult, but it is one of the best things that you can do for your health. It is never too late to quit. How do I get ready to quit? When you decide to quit smoking, create a plan to help you succeed. Before you quit:  Pick a date to quit. Set a date within the next 2 weeks to give you time to prepare.  Write down the reasons why you are quitting. Keep this list in places where you will see it often.  Tell your family, friends, and co-workers that you are quitting. Support from your loved ones can make quitting easier.  Talk with your health care provider about your options for quitting smoking.  Find out what treatment options are covered by your health insurance.  Identify people, places, things, and activities that make you want to smoke (triggers). Avoid them. What first steps can I take to quit smoking?  Throw away all cigarettes at home, at work, and in your car.  Throw away smoking accessories, such as Set designer.  Clean your car. Make sure to empty the ashtray.  Clean your home, including curtains and carpets. What strategies can I use to quit smoking? Talk with your health care provider about combining strategies, such as taking medicines while you are also receiving in-person counseling. Using these two strategies together makes you more likely to succeed in quitting  than if you used either strategy on its own.  If you are pregnant or breastfeeding, talk with your health care provider about finding counseling or other support strategies to quit smoking. Do not take medicine to help you quit smoking unless your health care provider tells you to do so. To quit smoking: Quit right away  Quit smoking completely, instead of gradually reducing how much you smoke over a period of time. Research shows that stopping smoking right away is more successful than gradually quitting.  Attend in-person counseling to help you build problem-solving skills. You are more likely to succeed in quitting if you attend counseling sessions regularly. Even short sessions of 10 minutes can be effective. Take medicine You may take medicines to help you quit smoking. Some medicines require a prescription and some you can purchase over-the-counter. Medicines may have nicotine in them to replace the nicotine in cigarettes. Medicines may:  Help to stop cravings.  Help to relieve withdrawal symptoms. Your health care provider may recommend:  Nicotine patches, gum, or lozenges.  Nicotine inhalers or sprays.  Non-nicotine medicine that is taken by mouth. Find resources Find resources and support systems that can help you to quit smoking and remain smoke-free after you quit. These resources are most helpful when you use them often. They include:  Online chats with a Veterinary surgeon.  Telephone quitlines.  Printed Materials engineer.  Support groups or group counseling.  Text messaging programs.  Mobile phone  apps or applications. Use apps that can help you stick to your quit plan by providing reminders, tips, and encouragement. There are many free apps for mobile devices as well as websites. Examples include Quit Guide from the [employer] and smokefree.gov What things can I do to make it easier to quit?   Reach out to your family and friends for support and encouragement. Call telephone  quitlines (1-800-QUIT-NOW), reach out to support groups, or work with a counselor for support.  Ask people who smoke to avoid smoking around you.  Avoid places that trigger you to smoke, such as bars, parties, or smoke-break areas at work.  Spend time with people who do not smoke.  Lessen the stress in your life. Stress can be a smoking trigger for some people. To lessen stress, try: ? Exercising regularly. ? Doing deep-breathing exercises. ? Doing yoga. ? Meditating. ? Performing a body scan. This involves closing your eyes, scanning your body from head to toe, and noticing which parts of your body are particularly tense. Try to relax the muscles in those areas. How will I feel when I quit smoking? Day 1 to 3 weeks Within the first 24 hours of quitting smoking, you may start to feel withdrawal symptoms. These symptoms are usually most noticeable 2-3 days after quitting, but they usually do not last for more than 2-3 weeks. You may experience these symptoms:  Mood swings.  Restlessness, anxiety, or irritability.  Trouble concentrating.  Dizziness.  Strong cravings for sugary foods and nicotine.  Mild weight gain.  Constipation.  Nausea.  Coughing or a sore throat.  Changes in how the medicines that you take for unrelated issues work in your body.  Depression.  Trouble sleeping (insomnia). Week 3 and afterward After the first 2-3 weeks of quitting, you may start to notice more positive results, such as:  Improved sense of smell and taste.  Decreased coughing and sore throat.  Slower heart rate.  Lower blood pressure.  Clearer skin.  The ability to breathe more easily.  Fewer sick days. Quitting smoking can be very challenging. Do not get discouraged if you are not successful the first time. Some people need to make many attempts to quit before they achieve long-term success. Do your best to stick to your quit plan, and talk with your health care provider if you  have any questions or concerns. Summary  Smoking tobacco is the leading cause of preventable death. Quitting smoking is one of the best things that you can do for your health.  When you decide to quit smoking, create a plan to help you succeed.  Quit smoking right away, not slowly over a period of time.  When you start quitting, seek help from your health care provider, family, or friends. This information is not intended to replace advice given to you by your health care provider. Make sure you discuss any questions you have with your health care provider. Document Revised: 12/31/2018 Document Reviewed: 06/26/2018 Elsevier Patient Education  2020 [employer].

## 2019-11-30 ENCOUNTER — Encounter: Payer: Self-pay | Admitting: Nurse Practitioner

## 2019-12-21 ENCOUNTER — Ambulatory Visit
Admission: RE | Admit: 2019-12-21 | Discharge: 2019-12-21 | Disposition: A | Discharge status: Home / Self Care | Payer: 59 | Source: Ambulatory Visit | Attending: Physician Assistant | Admitting: Physician Assistant

## 2019-12-21 ENCOUNTER — Other Ambulatory Visit: Payer: Self-pay

## 2019-12-21 DIAGNOSIS — M25511 Pain in right shoulder: Secondary | ICD-10-CM | POA: Diagnosis not present

## 2019-12-21 DIAGNOSIS — M19011 Primary osteoarthritis, right shoulder: Secondary | ICD-10-CM | POA: Diagnosis not present

## 2019-12-21 MED ORDER — SODIUM CHLORIDE (PF) 0.9 % IJ SOLN
10.0000 mL | Freq: Once | INTRAMUSCULAR | Status: AC
  Administered 2019-12-21: 11:00:00 10 mL

## 2019-12-21 MED ORDER — LIDOCAINE HCL (PF) 1 % IJ SOLN
5.0000 mL | Freq: Once | INTRAMUSCULAR | Status: AC
  Administered 2019-12-21: 11:00:00 5 mL
  Filled 2019-12-21: qty 5

## 2019-12-21 MED ORDER — IOHEXOL 180 MG/ML  SOLN
10.0000 mL | Freq: Once | INTRAMUSCULAR | Status: AC | PRN
Start: 2019-12-21 — End: 2019-12-21
  Administered 2019-12-21: 11:00:00 10 mL via INTRA_ARTICULAR

## 2019-12-21 MED ORDER — GADOBUTROL 1 MMOL/ML IV SOLN
0.0500 mL | Freq: Once | INTRAVENOUS | Status: AC | PRN
  Administered 2019-12-21: 11:00:00 0.05 mL

## 2020-01-03 ENCOUNTER — Ambulatory Visit: Payer: 59 | Admitting: Nurse Practitioner

## 2020-01-03 DIAGNOSIS — Z0289 Encounter for other administrative examinations: Secondary | ICD-10-CM

## 2020-01-04 ENCOUNTER — Telehealth: Payer: Self-pay | Admitting: Nurse Practitioner

## 2020-01-04 NOTE — Telephone Encounter (Signed)
Pt's wife called and states that pt tested positive today for covid. Pt has been vaccinated and wants to know what to do to relieve symptoms of chest tightness, congestion. Please call her back @ [phone]

## 2020-01-04 NOTE — Telephone Encounter (Signed)
Spoke with patient and advised to go to UC to be checked out; patient states if it gets worse he will. Patient couldn't get to pen and paper and asked if I could send info for infusion thru mychart so I sent info to him in message.

## 2020-01-04 NOTE — Telephone Encounter (Signed)
Patient also feels very achy, has headache, has low grade fever, and does has congestion and chest tightness; no SOB. Patient has been taking ibuprofen. Patient states he started having sx on Sunday/Monday. Patient would like to know what he needs to take and if he needs an appt? There are no open appts this week.

## 2020-01-04 NOTE — Telephone Encounter (Addendum)
If he is having chest tightness- he should go to acute care for in person evaluation today. He is also eligible for the MAB infusion- Please call and give the MAB center his name to contact. If he tested at [facility]- they will already know about him for the MAB infusion.

## 2020-01-05 ENCOUNTER — Other Ambulatory Visit (HOSPITAL_COMMUNITY): Payer: Self-pay | Admitting: Nurse Practitioner

## 2020-01-05 ENCOUNTER — Telehealth (HOSPITAL_COMMUNITY): Payer: Self-pay | Admitting: Nurse Practitioner

## 2020-01-05 ENCOUNTER — Encounter: Payer: Self-pay | Admitting: Nurse Practitioner

## 2020-01-05 DIAGNOSIS — U071 COVID-19: Secondary | ICD-10-CM

## 2020-01-05 NOTE — Telephone Encounter (Signed)
Called and left VM at infusion center to refer patient for infusion

## 2020-01-05 NOTE — Progress Notes (Signed)
I connected by phone with Taylor Dalton on 01/05/2020 at 4:53 PM to discuss the potential use of an new treatment for mild to moderate COVID-19 viral infection in non-hospitalized patients.  This patient is a 44 y.o. male that meets the FDA criteria for Emergency Use Authorization of casirivimab\imdevimab.  Has a (+) direct SARS-CoV-2 viral test result  Has mild or moderate COVID-19   Is ? 45 years of age and weighs ? 40 kg  Is NOT hospitalized due to COVID-19  Is NOT requiring oxygen therapy or requiring an increase in baseline oxygen flow rate due to COVID-19  Is within 10 days of symptom onset  Has at least one of the high risk factor(s) for progression to severe COVID-19 and/or hospitalization as defined in EUA.  Specific high risk criteria : BMI > 25   Sx onset 01/02/20.    I have spoken and communicated the following to the patient or parent/caregiver:  1. FDA has authorized the emergency use of casirivimab\imdevimab for the treatment of mild to moderate COVID-19 in adults and pediatric patients with positive results of direct SARS-CoV-2 viral testing who are 38 years of age and older weighing at least 40 kg, and who are at high risk for progressing to severe COVID-19 and/or hospitalization.  2. The significant known and potential risks and benefits of casirivimab\imdevimab, and the extent to which such potential risks and benefits are unknown.  3. Information on available alternative treatments and the risks and benefits of those alternatives, including clinical trials.  4. Patients treated with casirivimab\imdevimab should continue to self-isolate and use infection control measures (e.g., wear mask, isolate, social distance, avoid sharing personal items, clean and disinfect "high touch" surfaces, and frequent handwashing) according to CDC guidelines.   5. The patient or parent/caregiver has the option to accept or refuse casirivimab\imdevimab .  After reviewing this  information with the patient, The patient agreed to proceed with receiving casirivimab\imdevimab infusion and will be provided a copy of the Fact sheet prior to receiving the infusion.Consuello Masse, DNP, AGNP-C [phone] (Infusion Center Hotline)

## 2020-01-05 NOTE — Telephone Encounter (Signed)
Please do the infusion referral.

## 2020-01-05 NOTE — Telephone Encounter (Signed)
Called to Discuss with patient about Covid symptoms and the use of regeneron, a monoclonal antibody infusion for those with mild to moderate Covid symptoms and at a high risk of hospitalization.     Pt is qualified for this infusion at the [facility] center due to co-morbid conditions and/or a member of an at-risk group.     Unable to reach pt. Left message to return call. Sent [employer]. Sent Text.   Consuello Masse, DNP, AGNP-C [phone] (Infusion Center Hotline)

## 2020-01-05 NOTE — Telephone Encounter (Signed)
Patient has appt on wed at 8am for video visit. Patient needs a referral for the infusion but I do not know how to do the referral for this. Do we send an actual referral like we do for other offices?

## 2020-01-06 ENCOUNTER — Ambulatory Visit (HOSPITAL_COMMUNITY)
Admission: RE | Admit: 2020-01-06 | Discharge: 2020-01-06 | Disposition: A | Discharge status: Home / Self Care | Payer: 59 | Source: Ambulatory Visit | Attending: Pulmonary Disease | Admitting: Pulmonary Disease

## 2020-01-06 DIAGNOSIS — U071 COVID-19: Secondary | ICD-10-CM | POA: Diagnosis present

## 2020-01-06 MED ORDER — SODIUM CHLORIDE 0.9 % IV SOLN
1200.0000 mg | Freq: Once | INTRAVENOUS | Status: AC
  Administered 2020-01-06: 1200 mg via INTRAVENOUS

## 2020-01-06 MED ORDER — SODIUM CHLORIDE 0.9 % IV SOLN: INTRAVENOUS | Status: DC | PRN

## 2020-01-06 MED ORDER — DIPHENHYDRAMINE HCL 50 MG/ML IJ SOLN: 50.0000 mg | Freq: Once | INTRAMUSCULAR | Status: DC | PRN

## 2020-01-06 MED ORDER — ALBUTEROL SULFATE HFA 108 (90 BASE) MCG/ACT IN AERS: 2.0000 | INHALATION_SPRAY | Freq: Once | RESPIRATORY_TRACT | Status: DC | PRN

## 2020-01-06 MED ORDER — EPINEPHRINE 0.3 MG/0.3ML IJ SOAJ: 0.3000 mg | Freq: Once | INTRAMUSCULAR | Status: DC | PRN

## 2020-01-06 MED ORDER — METHYLPREDNISOLONE SODIUM SUCC 125 MG IJ SOLR: 125.0000 mg | Freq: Once | INTRAMUSCULAR | Status: DC | PRN

## 2020-01-06 MED ORDER — FAMOTIDINE IN NACL 20-0.9 MG/50ML-% IV SOLN: 20.0000 mg | Freq: Once | INTRAVENOUS | Status: DC | PRN

## 2020-01-06 NOTE — Discharge Instructions (Signed)
COVID-19 COVID-19 is a respiratory infection that is caused by a virus called severe acute respiratory syndrome coronavirus 2 (SARS-CoV-2). The disease is also known as coronavirus disease or novel coronavirus. In some people, the virus may not cause any symptoms. In others, it may cause a serious infection. The infection can get worse quickly and can lead to complications, such as:  Pneumonia, or infection of the lungs.  Acute respiratory distress syndrome or ARDS. This is a condition in which fluid build-up in the lungs prevents the lungs from filling with air and passing oxygen into the blood.  Acute respiratory failure. This is a condition in which there is not enough oxygen passing from the lungs to the body or when carbon dioxide is not passing from the lungs out of the body.  Sepsis or septic shock. This is a serious bodily reaction to an infection.  Blood clotting problems.  Secondary infections due to bacteria or fungus.  Organ failure. This is when your body's organs stop working. The virus that causes COVID-19 is contagious. This means that it can spread from person to person through droplets from coughs and sneezes (respiratory secretions). What are the causes? This illness is caused by a virus. You may catch the virus by:  Breathing in droplets from an infected person. Droplets can be spread by a person breathing, speaking, singing, coughing, or sneezing.  Touching something, like a table or a doorknob, that was exposed to the virus (contaminated) and then touching your mouth, nose, or eyes. What increases the risk? Risk for infection You are more likely to be infected with this virus if you:  Are within 6 feet (2 meters) of a person with COVID-19.  Provide care for or live with a person who is infected with COVID-19.  Spend time in crowded indoor spaces or live in shared housing. Risk for serious illness You are more likely to become seriously ill from the virus if  you:  Are 50 years of age or older. The higher your age, the more you are at risk for serious illness.  Live in a nursing home or long-term care facility.  Have cancer.  Have a long-term (chronic) disease such as: ? Chronic lung disease, including chronic obstructive pulmonary disease or asthma. ? A long-term disease that lowers your body's ability to fight infection (immunocompromised). ? Heart disease, including heart failure, a condition in which the arteries that lead to the heart become narrow or blocked (coronary artery disease), a disease which makes the heart muscle thick, weak, or stiff (cardiomyopathy). ? Diabetes. ? Chronic kidney disease. ? Sickle cell disease, a condition in which red blood cells have an abnormal "sickle" shape. ? Liver disease.  Are obese. What are the signs or symptoms? Symptoms of this condition can range from mild to severe. Symptoms may appear any time from 2 to 14 days after being exposed to the virus. They include:  A fever or chills.  A cough.  Difficulty breathing.  Headaches, body aches, or muscle aches.  Runny or stuffy (congested) nose.  A sore throat.  New loss of taste or smell. Some people may also have stomach problems, such as nausea, vomiting, or diarrhea. Other people may not have any symptoms of COVID-19. How is this diagnosed? This condition may be diagnosed based on:  Your signs and symptoms, especially if: ? You live in an area with a COVID-19 outbreak. ? You recently traveled to or from an area where the virus is common. ? You   provide care for or live with a person who was diagnosed with COVID-19. ? You were exposed to a person who was diagnosed with COVID-19.  A physical exam.  Lab tests, which may include: ? Taking a sample of fluid from the back of your nose and throat (nasopharyngeal fluid), your nose, or your throat using a swab. ? A sample of mucus from your lungs (sputum). ? Blood tests.  Imaging tests,  which may include, X-rays, CT scan, or ultrasound. How is this treated? At present, there is no medicine to treat COVID-19. Medicines that treat other diseases are being used on a trial basis to see if they are effective against COVID-19. Your health care provider will talk with you about ways to treat your symptoms. For most people, the infection is mild and can be managed at home with rest, fluids, and over-the-counter medicines. Treatment for a serious infection usually takes places in a hospital intensive care unit (ICU). It may include one or more of the following treatments. These treatments are given until your symptoms improve.  Receiving fluids and medicines through an IV.  Supplemental oxygen. Extra oxygen is given through a tube in the nose, a face mask, or a hood.  Positioning you to lie on your stomach (prone position). This makes it easier for oxygen to get into the lungs.  Continuous positive airway pressure (CPAP) or bi-level positive airway pressure (BPAP) machine. This treatment uses mild air pressure to keep the airways open. A tube that is connected to a motor delivers oxygen to the body.  Ventilator. This treatment moves air into and out of the lungs by using a tube that is placed in your windpipe.  Tracheostomy. This is a procedure to create a hole in the neck so that a breathing tube can be inserted.  Extracorporeal membrane oxygenation (ECMO). This procedure gives the lungs a chance to recover by taking over the functions of the heart and lungs. It supplies oxygen to the body and removes carbon dioxide. Follow these instructions at home: Lifestyle  If you are sick, stay home except to get medical care. Your health care provider will tell you how long to stay home. Call your health care provider before you go for medical care.  Rest at home as told by your health care provider.  Do not use any products that contain nicotine or tobacco, such as cigarettes,  e-cigarettes, and chewing tobacco. If you need help quitting, ask your health care provider.  Return to your normal activities as told by your health care provider. Ask your health care provider what activities are safe for you. General instructions  Take over-the-counter and prescription medicines only as told by your health care provider.  Drink enough fluid to keep your urine pale yellow.  Keep all follow-up visits as told by your health care provider. This is important. How is this prevented?  There is no vaccine to help prevent COVID-19 infection. However, there are steps you can take to protect yourself and others from this virus. To protect yourself:   Do not travel to areas where COVID-19 is a risk. The areas where COVID-19 is reported change often. To identify high-risk areas and travel restrictions, check the CDC travel website: wwwnc.cdc.gov/travel/notices  If you live in, or must travel to, an area where COVID-19 is a risk, take precautions to avoid infection. ? Stay away from people who are sick. ? Wash your hands often with soap and water for 20 seconds. If soap and water   are not available, use an alcohol-based hand sanitizer. ? Avoid touching your mouth, face, eyes, or nose. ? Avoid going out in public, follow guidance from your state and local health authorities. ? If you must go out in public, wear a cloth face covering or face mask. Make sure your mask covers your nose and mouth. ? Avoid crowded indoor spaces. Stay at least 6 feet (2 meters) away from others. ? Disinfect objects and surfaces that are frequently touched every day. This may include:  Counters and tables.  Doorknobs and light switches.  Sinks and faucets.  Electronics, such as phones, remote controls, keyboards, computers, and tablets. To protect others: If you have symptoms of COVID-19, take steps to prevent the virus from spreading to others.  If you think you have a COVID-19 infection, contact  your health care provider right away. Tell your health care team that you think you may have a COVID-19 infection.  Stay home. Leave your house only to seek medical care. Do not use public transport.  Do not travel while you are sick.  Wash your hands often with soap and water for 20 seconds. If soap and water are not available, use alcohol-based hand sanitizer.  Stay away from other members of your household. Let healthy household members care for children and pets, if possible. If you have to care for children or pets, wash your hands often and wear a mask. If possible, stay in your own room, separate from others. Use a different bathroom.  Make sure that all people in your household wash their hands well and often.  Cough or sneeze into a tissue or your sleeve or elbow. Do not cough or sneeze into your hand or into the air.  Wear a cloth face covering or face mask. Make sure your mask covers your nose and mouth. Where to find more information  Centers for Disease Control and Prevention: www.cdc.gov/coronavirus/2019-ncov/index.html  World Health Organization: www.who.int/health-topics/coronavirus Contact a health care provider if:  You live in or have traveled to an area where COVID-19 is a risk and you have symptoms of the infection.  You have had contact with someone who has COVID-19 and you have symptoms of the infection. Get help right away if:  You have trouble breathing.  You have pain or pressure in your chest.  You have confusion.  You have bluish lips and fingernails.  You have difficulty waking from sleep.  You have symptoms that get worse. These symptoms may represent a serious problem that is an emergency. Do not wait to see if the symptoms will go away. Get medical help right away. Call your local emergency services (911 in the [place] Do not drive yourself to the hospital. Let the emergency medical personnel know if you think you have  COVID-19. Summary  COVID-19 is a respiratory infection that is caused by a virus. It is also known as coronavirus disease or novel coronavirus. It can cause serious infections, such as pneumonia, acute respiratory distress syndrome, acute respiratory failure, or sepsis.  The virus that causes COVID-19 is contagious. This means that it can spread from person to person through droplets from breathing, speaking, singing, coughing, or sneezing.  You are more likely to develop a serious illness if you are 50 years of age or older, have a weak immune system, live in a nursing home, or have chronic disease.  There is no medicine to treat COVID-19. Your health care provider will talk with you about ways to treat your symptoms.    Take steps to protect yourself and others from infection. Wash your hands often and disinfect objects and surfaces that are frequently touched every day. Stay away from people who are sick and wear a mask if you are sick. This information is not intended to replace advice given to you by your health care provider. Make sure you discuss any questions you have with your health care provider. Document Revised: 02/04/2019 Document Reviewed: 05/13/2018 Elsevier Patient Education  2020 [employer] What types of side effects do monoclonal antibody drugs cause?  Common side effects  In general, the more common side effects caused by monoclonal antibody drugs include: . Allergic reactions, such as hives or itching . Flu-like signs and symptoms, including chills, fatigue, fever, and muscle aches and pains . Nausea, vomiting . Diarrhea . Skin rashes . Low blood pressure   The CDC is recommending patients who receive monoclonal antibody treatments wait at least 90 days before being vaccinated.  Currently, there are no data on the safety and efficacy of mRNA COVID-19 vaccines in persons who received monoclonal antibodies or convalescent plasma as part of COVID-19 treatment. Based  on the estimated half-life of such therapies as well as evidence suggesting that reinfection is uncommon in the 90 days after initial infection, vaccination should be deferred for at least 90 days, as a precautionary measure until additional information becomes available, to avoid interference of the antibody treatment with vaccine-induced immune responses. 

## 2020-01-06 NOTE — Progress Notes (Signed)
  Diagnosis: COVID-19  Physician: dr wright  Procedure: [facility] Infusion [facility] Med: casirivimab\imdevimab infusion - Provided patient with casirivimab\imdevimab fact sheet for patients, parents and caregivers prior to infusion.  Complications: No immediate complications noted.  Discharge: Discharged home   Taylor Dalton R 01/06/2020   

## 2020-01-11 ENCOUNTER — Other Ambulatory Visit: Payer: Self-pay

## 2020-01-11 ENCOUNTER — Encounter: Payer: Self-pay | Admitting: Nurse Practitioner

## 2020-01-11 ENCOUNTER — Telehealth (INDEPENDENT_AMBULATORY_CARE_PROVIDER_SITE_OTHER): Payer: 59 | Admitting: Nurse Practitioner

## 2020-01-11 VITALS — Ht 75.0 in | Wt 250.0 lb

## 2020-01-11 DIAGNOSIS — U071 COVID-19: Secondary | ICD-10-CM | POA: Diagnosis not present

## 2020-01-11 DIAGNOSIS — F17290 Nicotine dependence, other tobacco product, uncomplicated: Secondary | ICD-10-CM | POA: Insufficient documentation

## 2020-01-11 MED ORDER — ALBUTEROL SULFATE HFA 108 (90 BASE) MCG/ACT IN AERS: 2.0000 | INHALATION_SPRAY | Freq: Four times a day (QID) | RESPIRATORY_TRACT | 0 refills | Status: DC | PRN

## 2020-01-11 MED ORDER — DEXTROMETHORPHAN-GUAIFENESIN 5-100 MG/5ML PO LIQD: 10.0000 mL | Freq: Every evening | ORAL | 0 refills | Status: DC | PRN

## 2020-01-11 NOTE — Progress Notes (Signed)
Virtual Visit via Video Note  This visit type was conducted due to national recommendations for restrictions regarding the COVID-19 pandemic (e.g. social distancing).  This format is felt to be most appropriate for this patient at this time.  All issues noted in this document were discussed and addressed.  No physical exam was performed (except for noted visual exam findings with Video Visits).   I connected with@ on 01/11/20 at  8:00 AM EDT by a video enabled telemedicine application or telephone and verified that I am speaking with the correct person using two identifiers. Location patient: home Location provider: work or home office Persons participating in the virtual visit: patient, provider  I discussed the limitations, risks, security and privacy concerns of performing an evaluation and management service by telephone and the availability of in person appointments. I also discussed with the patient that there may be a patient responsible charge related to this service. The patient expressed understanding and agreed to proceed.  Reason for visit: Follow-up for Covid still having body aches, fatigue, taking ibuprofen and cough drops.  HPI: This 44 year old male with history of smoking, obesity, diagnosed with Covid infection 01/04/2020 last Wednesday and had the [facility] infusion on 01/06/2020-Friday.  He reports that he is really feeling no improvement.  His symptoms are:  he feels rundown, intermittent headache, mild nasal congestion and sometimes it hurts at the bridge of his nose on and off, sore throat with a little laryngitis, and mild cough.  T-max is 100.3 and basically running about 99 degrees.  He does have hot and cold chills and feels like his temperature is unstable.  He does have very mild dyspnea on exertion and scant lightheadedness when he got out of bed to help his wife with her car.  He has been spending a lot of time in the chair and in the bed.  He has no shortness of breath at rest.   He has noted no wheezing.  He had scant chest tightness with dry cough.  Cough is mild.  No chest pain.  He does not have a pulse oximeter.  He has not smoked any cigarettes since he has been ill.  He is eating and drinking normally without any GI complaints.    ROS: See pertinent positives and negatives per HPI.  Past Medical History:  Diagnosis Date  . Chicken pox   . Headache     Past Surgical History:  Procedure Laterality Date  . KNEE SURGERY Right     Family History  Problem Relation Age of Onset  . Prostate cancer Paternal Grandmother   . Heart disease Father   . Cancer Brother   . Early death Brother   . Cancer Maternal Grandmother   . Asthma Brother     SOCIAL HX: Smoking   Current Outpatient Medications:  .  cyclobenzaprine (FLEXERIL) 10 MG tablet, Take 1 tablet (10 mg total) by mouth at bedtime as needed for muscle spasms., Disp: 30 tablet, Rfl: 0 .  albuterol (VENTOLIN HFA) 108 (90 Base) MCG/ACT inhaler, Inhale 2 puffs into the lungs every 6 (six) hours as needed for wheezing or shortness of breath., Disp: 8 g, Rfl: 0 .  Dextromethorphan-guaiFENesin 5-100 MG/5ML LIQD, Take 10 mLs by mouth at bedtime as needed for up to 7 days (for cough)., Disp: 70 mL, Rfl: 0  EXAM:  VITALS per patient if applicable:  GENERAL: alert, oriented, fatigued, in bed talking,  in no acute distress  HEENT: atraumatic, conjunctiva clear, no obvious abnormalities  on inspection of external nose and ears.  No congested tone to the voice.  NECK: normal movements of the head and neck  LUNGS: on inspection no signs of respiratory distress, breathing rate appears normal, no obvious gross SOB, gasping or wheezing.  No spontaneous coughing.  I asked him to cough, and it is a dry non wheezy cough.  CV: no obvious cyanosis  MS: moves all visible extremities without noticeable abnormality  PSYCH/NEURO: pleasant and cooperative, no obvious depression or anxiety, speech and thought processing  grossly intact  ASSESSMENT AND PLAN:  Discussed the following assessment and plan:  COVID-19 virus infection  Nicotine dependence due to vaping tobacco product  COVID-19 virus infection Please purchase a pulse oximeter at any pharmacy and measure your oxygen saturations.  If your oxygen level falls below 90% and stays there, go to the ER-emergency room for urgent assistance.   I would like you to be seen at the [facility] for an in-person evaluation because you are not improving since you were diagnosed with Covid last Wed. They will call you with an appt.   I have sent albuterol inhaler to your pharmacy .    Albuterol inhaler:  2 puffs every 6 hours as needed for wheezing or cheat tightness or frequent cough  (this will make your heart race a little).  I have ordered Mucinex DM to take at bedtime for cough.    Some providers find vitamins are helpful- but monitor for sotmach upset and if occurs- discnotinue: Begin Vit C 1000 mg daily  Vitamin D3 4000 IU daily  Zinc 100 g daily  Quercetin 250 mg -500 mg twice daily   Rest, hydrate very well, eat healthy protein food, Tylenol or Advil as directed, monitor for fever. Please move around, sit up, walk and do not spend all of your time in bed. Monitor your oxygen sats when you walk.   You are unable to smoke now and this is a great time to stop smoking. Look into [facility] [place] for free help - 24/7days call line- [phone] for tips on how to help you stop smoking.   Remain in self quarantine until at least 10 days since symptom onset AND 3 consecutive days fever free without antipyretics AND improvement in respiratory symptoms.    I discussed the assessment and treatment plan with the patient. The patient was provided an opportunity to ask questions and all were answered. The patient agreed with the plan and demonstrated an understanding of the instructions.   The patient was advised to call back or seek an in-person evaluation  if the symptoms worsen or if the condition fails to improve as anticipated.  Amedeo Kinsman, NP Adult Nurse Practitioner [facility] [employer] [phone]

## 2020-01-11 NOTE — Assessment & Plan Note (Signed)
Please purchase a pulse oximeter at any pharmacy and measure your oxygen saturations.  If your oxygen level falls below 90% and stays there, go to the ER-emergency room for urgent assistance.   I would like you to be seen at the [facility] for an in-person evaluation because you are not improving since you were diagnosed with Covid last Wed. They will call you with an appt.   I have sent albuterol inhaler to your pharmacy .    Albuterol inhaler:  2 puffs every 6 hours as needed for wheezing or cheat tightness or frequent cough  (this will make your heart race a little).  I have ordered Mucinex DM to take at bedtime for cough.    Some providers find vitamins are helpful- but monitor for sotmach upset and if occurs- discnotinue: Begin Vit C 1000 mg daily  Vitamin D3 4000 IU daily  Zinc 100 g daily  Quercetin 250 mg -500 mg twice daily   Rest, hydrate very well, eat healthy protein food, Tylenol or Advil as directed, monitor for fever. Please move around, sit up, walk and do not spend all of your time in bed. Monitor your oxygen sats when you walk.   You are unable to smoke now and this is a great time to stop smoking. Look into [facility] [place] for free help - 24/7days call line- [phone] for tips on how to help you stop smoking.   Remain in self quarantine until at least 10 days since symptom onset AND 3 consecutive days fever free without antipyretics AND improvement in respiratory symptoms.

## 2020-01-11 NOTE — Patient Instructions (Addendum)
Please purchase a pulse oximeter at any pharmacy and measure your oxygen saturations.  If your oxygen level falls below 90% and stays there, go to the ER-emergency room for urgent assistance.   I would like you to be seen at the [facility] for an in-person evaluation because you are not improving since you were diagnosed with Covid last Wed. They will call you with an appt.   I have sent albuterol inhaler to your pharmacy .    Albuterol inhaler:  2 puffs every 6 hours as needed for wheezing or cheat tightness or frequent cough  (this will make your heart race a little).  I have ordered Mucinex DM to take at bedtime for cough.    Some providers find vitamins are helpful- but monitor for sotmach upset and if occurs- discnotinue: Begin Vit C 1000 mg daily  Vitamin D3 4000 IU daily  Zinc 100 g daily  Quercetin 250 mg -500 mg twice daily   Rest, hydrate very well, eat healthy protein food, Tylenol or Advil as directed, monitor for fever. Please move around, sit up, walk and do not spend all of your time in bed. Monitor your oxygen sats when you walk.   You are unable to smoke now and this is a great time to stop smoking. Look into QUITline [place] for free help - 24/7days call line- [phone] for tips on how to help you stop smoking.   Remain in self quarantine until at least 10 days since symptom onset AND 3 consecutive days fever free without antipyretics AND improvement in respiratory symptoms.     10 Things You Can Do to Manage Your COVID-19 Symptoms at Home If you have possible or confirmed COVID-19: 1. Stay home from work and school. And stay away from other public places. If you must go out, avoid using any kind of public transportation, ridesharing, or taxis. 2. Monitor your symptoms carefully. If your symptoms get worse, call your healthcare provider immediately. 3. Get rest and stay hydrated. 4. If you have a medical appointment, call the healthcare provider ahead of time and  tell them that you have or may have COVID-19. 5. For medical emergencies, call 911 and notify the dispatch personnel that you have or may have COVID-19. 6. Cover your cough and sneezes with a tissue or use the inside of your elbow. 7. Wash your hands often with soap and water for at least 20 seconds or clean your hands with an alcohol-based hand sanitizer that contains at least 60% alcohol. 8. As much as possible, stay in a specific room and away from other people in your home. Also, you should use a separate bathroom, if available. If you need to be around other people in or outside of the home, wear a mask. 9. Avoid sharing personal items with other people in your household, like dishes, towels, and bedding. 10. Clean all surfaces that are touched often, like counters, tabletops, and doorknobs. Use household cleaning sprays or wipes according to the label instructions. [email or web address] 10/20/2018 This information is not intended to replace advice given to you by your health care provider. Make sure you discuss any questions you have with your health care provider. Document Revised: 03/24/2019 Document Reviewed: 03/24/2019 Elsevier Patient Education  2020 [employer].  Steps to Quit Smoking Smoking tobacco is the leading cause of preventable death. It can affect almost every organ in the body. Smoking puts you and those around you at risk for developing many serious chronic diseases. Quitting smoking  can be difficult, but it is one of the best things that you can do for your health. It is never too late to quit. How do I get ready to quit? When you decide to quit smoking, create a plan to help you succeed. Before you quit:  Pick a date to quit. Set a date within the next 2 weeks to give you time to prepare.  Write down the reasons why you are quitting. Keep this list in places where you will see it often.  Tell your family, friends, and co-workers that you are quitting. Support from your  loved ones can make quitting easier.  Talk with your health care provider about your options for quitting smoking.  Find out what treatment options are covered by your health insurance.  Identify people, places, things, and activities that make you want to smoke (triggers). Avoid them. What first steps can I take to quit smoking?  Throw away all cigarettes at home, at work, and in your car.  Throw away smoking accessories, such as Set designer.  Clean your car. Make sure to empty the ashtray.  Clean your home, including curtains and carpets. What strategies can I use to quit smoking? Talk with your health care provider about combining strategies, such as taking medicines while you are also receiving in-person counseling. Using these two strategies together makes you more likely to succeed in quitting than if you used either strategy on its own.  If you are pregnant or breastfeeding, talk with your health care provider about finding counseling or other support strategies to quit smoking. Do not take medicine to help you quit smoking unless your health care provider tells you to do so. To quit smoking: Quit right away  Quit smoking completely, instead of gradually reducing how much you smoke over a period of time. Research shows that stopping smoking right away is more successful than gradually quitting.  Attend in-person counseling to help you build problem-solving skills. You are more likely to succeed in quitting if you attend counseling sessions regularly. Even short sessions of 10 minutes can be effective. Take medicine You may take medicines to help you quit smoking. Some medicines require a prescription and some you can purchase over-the-counter. Medicines may have nicotine in them to replace the nicotine in cigarettes. Medicines may:  Help to stop cravings.  Help to relieve withdrawal symptoms. Your health care provider may recommend:  Nicotine patches, gum, or  lozenges.  Nicotine inhalers or sprays.  Non-nicotine medicine that is taken by mouth. Find resources Find resources and support systems that can help you to quit smoking and remain smoke-free after you quit. These resources are most helpful when you use them often. They include:  Online chats with a Veterinary surgeon.  Telephone quitlines.  Printed Materials engineer.  Support groups or group counseling.  Text messaging programs.  Mobile phone apps or applications. Use apps that can help you stick to your quit plan by providing reminders, tips, and encouragement. There are many free apps for mobile devices as well as websites. Examples include Quit Guide from the [employer] and smokefree.gov What things can I do to make it easier to quit?   Reach out to your family and friends for support and encouragement. Call telephone quitlines (1-800-QUIT-NOW), reach out to support groups, or work with a counselor for support.  Ask people who smoke to avoid smoking around you.  Avoid places that trigger you to smoke, such as bars, parties, or smoke-break areas at work.  Spend time with people who do not smoke.  Lessen the stress in your life. Stress can be a smoking trigger for some people. To lessen stress, try: ? Exercising regularly. ? Doing deep-breathing exercises. ? Doing yoga. ? Meditating. ? Performing a body scan. This involves closing your eyes, scanning your body from head to toe, and noticing which parts of your body are particularly tense. Try to relax the muscles in those areas. How will I feel when I quit smoking? Day 1 to 3 weeks Within the first 24 hours of quitting smoking, you may start to feel withdrawal symptoms. These symptoms are usually most noticeable 2-3 days after quitting, but they usually do not last for more than 2-3 weeks. You may experience these symptoms:  Mood swings.  Restlessness, anxiety, or irritability.  Trouble concentrating.  Dizziness.  Strong cravings  for sugary foods and nicotine.  Mild weight gain.  Constipation.  Nausea.  Coughing or a sore throat.  Changes in how the medicines that you take for unrelated issues work in your body.  Depression.  Trouble sleeping (insomnia). Week 3 and afterward After the first 2-3 weeks of quitting, you may start to notice more positive results, such as:  Improved sense of smell and taste.  Decreased coughing and sore throat.  Slower heart rate.  Lower blood pressure.  Clearer skin.  The ability to breathe more easily.  Fewer sick days. Quitting smoking can be very challenging. Do not get discouraged if you are not successful the first time. Some people need to make many attempts to quit before they achieve long-term success. Do your best to stick to your quit plan, and talk with your health care provider if you have any questions or concerns. Summary  Smoking tobacco is the leading cause of preventable death. Quitting smoking is one of the best things that you can do for your health.  When you decide to quit smoking, create a plan to help you succeed.  Quit smoking right away, not slowly over a period of time.  When you start quitting, seek help from your health care provider, family, or friends. This information is not intended to replace advice given to you by your health care provider. Make sure you discuss any questions you have with your health care provider. Document Revised: 12/31/2018 Document Reviewed: 06/26/2018 Elsevier Patient Education  2020 Elsevier Inc.  COVID-19 COVID-19 is a respiratory infection that is caused by a virus called severe acute respiratory syndrome coronavirus 2 (SARS-CoV-2). The disease is also known as coronavirus disease or novel coronavirus. In some people, the virus may not cause any symptoms. In others, it may cause a serious infection. The infection can get worse quickly and can lead to complications, such as:  Pneumonia, or infection of the  lungs.  Acute respiratory distress syndrome or ARDS. This is a condition in which fluid build-up in the lungs prevents the lungs from filling with air and passing oxygen into the blood.  Acute respiratory failure. This is a condition in which there is not enough oxygen passing from the lungs to the body or when carbon dioxide is not passing from the lungs out of the body.  Sepsis or septic shock. This is a serious bodily reaction to an infection.  Blood clotting problems.  Secondary infections due to bacteria or fungus.  Organ failure. This is when your body's organs stop working. The virus that causes COVID-19 is contagious. This means that it can spread from person to person through  droplets from coughs and sneezes (respiratory secretions). What are the causes? This illness is caused by a virus. You may catch the virus by:  Breathing in droplets from an infected person. Droplets can be spread by a person breathing, speaking, singing, coughing, or sneezing.  Touching something, like a table or a doorknob, that was exposed to the virus (contaminated) and then touching your mouth, nose, or eyes. What increases the risk? Risk for infection You are more likely to be infected with this virus if you:  Are within 6 feet (2 meters) of a person with COVID-19.  Provide care for or live with a person who is infected with COVID-19.  Spend time in crowded indoor spaces or live in shared housing. Risk for serious illness You are more likely to become seriously ill from the virus if you:  Are 40 years of age or older. The higher your age, the more you are at risk for serious illness.  Live in a nursing home or long-term care facility.  Have cancer.  Have a long-term (chronic) disease such as: ? Chronic lung disease, including chronic obstructive pulmonary disease or asthma. ? A long-term disease that lowers your body's ability to fight infection (immunocompromised). ? Heart disease,  including heart failure, a condition in which the arteries that lead to the heart become narrow or blocked (coronary artery disease), a disease which makes the heart muscle thick, weak, or stiff (cardiomyopathy). ? Diabetes. ? Chronic kidney disease. ? Sickle cell disease, a condition in which red blood cells have an abnormal "sickle" shape. ? Liver disease.  Are obese. What are the signs or symptoms? Symptoms of this condition can range from mild to severe. Symptoms may appear any time from 2 to 14 days after being exposed to the virus. They include:  A fever or chills.  A cough.  Difficulty breathing.  Headaches, body aches, or muscle aches.  Runny or stuffy (congested) nose.  A sore throat.  New loss of taste or smell. Some people may also have stomach problems, such as nausea, vomiting, or diarrhea. Other people may not have any symptoms of COVID-19. How is this diagnosed? This condition may be diagnosed based on:  Your signs and symptoms, especially if: ? You live in an area with a COVID-19 outbreak. ? You recently traveled to or from an area where the virus is common. ? You provide care for or live with a person who was diagnosed with COVID-19. ? You were exposed to a person who was diagnosed with COVID-19.  A physical exam.  Lab tests, which may include: ? Taking a sample of fluid from the back of your nose and throat (nasopharyngeal fluid), your nose, or your throat using a swab. ? A sample of mucus from your lungs (sputum). ? Blood tests.  Imaging tests, which may include, X-rays, CT scan, or ultrasound. How is this treated? At present, there is no medicine to treat COVID-19. Medicines that treat other diseases are being used on a trial basis to see if they are effective against COVID-19. Your health care provider will talk with you about ways to treat your symptoms. For most people, the infection is mild and can be managed at home with rest, fluids, and  over-the-counter medicines. Treatment for a serious infection usually takes places in a hospital intensive care unit (ICU). It may include one or more of the following treatments. These treatments are given until your symptoms improve.  Receiving fluids and medicines through an IV.  Supplemental oxygen. Extra oxygen is given through a tube in the nose, a face mask, or a hood.  Positioning you to lie on your stomach (prone position). This makes it easier for oxygen to get into the lungs.  Continuous positive airway pressure (CPAP) or bi-level positive airway pressure (BPAP) machine. This treatment uses mild air pressure to keep the airways open. A tube that is connected to a motor delivers oxygen to the body.  Ventilator. This treatment moves air into and out of the lungs by using a tube that is placed in your windpipe.  Tracheostomy. This is a procedure to create a hole in the neck so that a breathing tube can be inserted.  Extracorporeal membrane oxygenation (ECMO). This procedure gives the lungs a chance to recover by taking over the functions of the heart and lungs. It supplies oxygen to the body and removes carbon dioxide. Follow these instructions at home: Lifestyle  If you are sick, stay home except to get medical care. Your health care provider will tell you how long to stay home. Call your health care provider before you go for medical care.  Rest at home as told by your health care provider.  Do not use any products that contain nicotine or tobacco, such as cigarettes, e-cigarettes, and chewing tobacco. If you need help quitting, ask your health care provider.  Return to your normal activities as told by your health care provider. Ask your health care provider what activities are safe for you. General instructions  Take over-the-counter and prescription medicines only as told by your health care provider.  Drink enough fluid to keep your urine pale yellow.  Keep all follow-up  visits as told by your health care provider. This is important. How is this prevented?  There is no vaccine to help prevent COVID-19 infection. However, there are steps you can take to protect yourself and others from this virus. To protect yourself:   Do not travel to areas where COVID-19 is a risk. The areas where COVID-19 is reported change often. To identify high-risk areas and travel restrictions, check the CDC travel website: [employer].siwwwnc.cdc.gov/travel/notices  If you live in, or must travel to, an area where COVID-19 is a risk, take precautions to avoid infection. ? Stay away from people who are sick. ? Wash your hands often with soap and water for 20 seconds. If soap and water are not available, use an alcohol-based hand sanitizer. ? Avoid touching your mouth, face, eyes, or nose. ? Avoid going out in public, follow guidance from your state and local health authorities. ? If you must go out in public, wear a cloth face covering or face mask. Make sure your mask covers your nose and mouth. ? Avoid crowded indoor spaces. Stay at least 6 feet (2 meters) away from others. ? Disinfect objects and surfaces that are frequently touched every day. This may include:  Counters and tables.  Doorknobs and light switches.  Sinks and faucets.  Electronics, such as phones, remote controls, keyboards, computers, and tablets. To protect others: If you have symptoms of COVID-19, take steps to prevent the virus from spreading to others.  If you think you have a COVID-19 infection, contact your health care provider right away. Tell your health care team that you think you may have a COVID-19 infection.  Stay home. Leave your house only to seek medical care. Do not use public transport.  Do not travel while you are sick.  Wash your hands often with soap and  water for 20 seconds. If soap and water are not available, use alcohol-based hand sanitizer.  Stay away from other members of your household. Let  healthy household members care for children and pets, if possible. If you have to care for children or pets, wash your hands often and wear a mask. If possible, stay in your own room, separate from others. Use a different bathroom.  Make sure that all people in your household wash their hands well and often.  Cough or sneeze into a tissue or your sleeve or elbow. Do not cough or sneeze into your hand or into the air.  Wear a cloth face covering or face mask. Make sure your mask covers your nose and mouth. Where to find more information  Centers for Disease Control and Prevention: StickerEmporium.tn  World Health Organization: https://thompson-craig.com/ Contact a health care provider if:  You live in or have traveled to an area where COVID-19 is a risk and you have symptoms of the infection.  You have had contact with someone who has COVID-19 and you have symptoms of the infection. Get help right away if:  You have trouble breathing.  You have pain or pressure in your chest.  You have confusion.  You have bluish lips and fingernails.  You have difficulty waking from sleep.  You have symptoms that get worse. These symptoms may represent a serious problem that is an emergency. Do not wait to see if the symptoms will go away. Get medical help right away. Call your local emergency services (911 in the [place] Do not drive yourself to the hospital. Let the emergency medical personnel know if you think you have COVID-19. Summary  COVID-19 is a respiratory infection that is caused by a virus. It is also known as coronavirus disease or novel coronavirus. It can cause serious infections, such as pneumonia, acute respiratory distress syndrome, acute respiratory failure, or sepsis.  The virus that causes COVID-19 is contagious. This means that it can spread from person to person through droplets from breathing, speaking, singing, coughing, or  sneezing.  You are more likely to develop a serious illness if you are 36 years of age or older, have a weak immune system, live in a nursing home, or have chronic disease.  There is no medicine to treat COVID-19. Your health care provider will talk with you about ways to treat your symptoms.  Take steps to protect yourself and others from infection. Wash your hands often and disinfect objects and surfaces that are frequently touched every day. Stay away from people who are sick and wear a mask if you are sick. This information is not intended to replace advice given to you by your health care provider. Make sure you discuss any questions you have with your health care provider. Document Revised: 02/04/2019 Document Reviewed: 05/13/2018 Elsevier Patient Education  2020 [employer].

## 2020-01-16 ENCOUNTER — Emergency Department
Admission: EM | Admit: 2020-01-16 | Discharge: 2020-01-16 | Disposition: A | Discharge status: Other Acute Inpatient Hospital | Payer: 59

## 2020-01-16 ENCOUNTER — Encounter: Payer: Self-pay | Admitting: Nurse Practitioner

## 2020-01-16 ENCOUNTER — Ambulatory Visit
Admission: RE | Admit: 2020-01-16 | Discharge: 2020-01-16 | Disposition: A | Discharge status: Home / Self Care | Payer: 59 | Source: Ambulatory Visit | Attending: Internal Medicine | Admitting: Internal Medicine

## 2020-01-16 ENCOUNTER — Telehealth (INDEPENDENT_AMBULATORY_CARE_PROVIDER_SITE_OTHER): Payer: 59 | Admitting: Nurse Practitioner

## 2020-01-16 VITALS — HR 108 | Temp 98.4°F | Ht 75.0 in | Wt 250.0 lb

## 2020-01-16 DIAGNOSIS — J019 Acute sinusitis, unspecified: Secondary | ICD-10-CM | POA: Diagnosis not present

## 2020-01-16 DIAGNOSIS — U071 COVID-19: Secondary | ICD-10-CM

## 2020-01-16 MED ORDER — GUAIFENESIN ER 600 MG PO TB12: 1200.0000 mg | ORAL_TABLET | Freq: Two times a day (BID) | ORAL | 0 refills | Status: AC | PRN

## 2020-01-16 MED ORDER — PREDNISONE 10 MG PO TABS: ORAL_TABLET | ORAL | 0 refills | Status: DC

## 2020-01-16 MED ORDER — ALBUTEROL SULFATE HFA 108 (90 BASE) MCG/ACT IN AERS: 2.0000 | INHALATION_SPRAY | Freq: Four times a day (QID) | RESPIRATORY_TRACT | 0 refills | Status: AC | PRN

## 2020-01-16 MED ORDER — AMOXICILLIN-POT CLAVULANATE 875-125 MG PO TABS: 1.0000 | ORAL_TABLET | Freq: Two times a day (BID) | ORAL | 0 refills | Status: DC

## 2020-01-16 NOTE — Patient Instructions (Addendum)
Please get your CXR today at [facility] entrance.   I am sending 3 prescriptions to your pharmacy:   1. Prednisone in a 6 day  tapering  dose:   Take 6 tablets all at once today,  then 5 tablets all at once tomorrow, and 4 tablets on day three and continue to reduce dose by 1 tablet daily until gone.  This is a 6-5-4-3-2-1- off taper.    2. Augmentin 875 mg twice daily for 10 days for sinus infection   3. Mucinex 1200 mg twice daily to thin secretions.  Use the Albuterol inhaler:  2 puffs every 6 hours as needed for wheezing. I refilled your inhaler.   4. Monitor pulse oximeter If your oxygen level falls below 90% and stays there, go to the ER  for urgent assistance. If it drops and stays at 94%- you need to be seen at an Urgent Care office for in-person evaluation.   5. Call us in 2 days for symptoms update.  Seek care sooner if feeling worse. Rest, drink a lot more fluids to thin nasal secretions so can clear out secretions.   6. Go to the [facility] as planned on Fri- even if feeling better.  7. Continue isolation for a minimum of 10 days.  All symptoms should be improving or resolved in order to break quarantine AND you have no fever for 3 days without using Tylenol or Advil or any med to lower fever.   10 Things You Can Do to Manage Your COVID-19 Symptoms at Home If you have possible or confirmed COVID-19: 1. Stay home from work and school. And stay away from other public places. If you must go out, avoid using any kind of public transportation, ridesharing, or taxis. 2. Monitor your symptoms carefully. If your symptoms get worse, call your healthcare provider immediately. 3. Get rest and stay hydrated. 4. If you have a medical appointment, call the healthcare provider ahead of time and tell them that you have or may have COVID-19. 5. For medical emergencies, call 911 and notify the dispatch personnel that you have or may have COVID-19. 6. Cover your cough and sneezes with a  tissue or use the inside of your elbow. 7. Wash your hands often with soap and water for at least 20 seconds or clean your hands with an alcohol-based hand sanitizer that contains at least 60% alcohol. 8. As much as possible, stay in a specific room and away from other people in your home. Also, you should use a separate bathroom, if available. If you need to be around other people in or outside of the home, wear a mask. 9. Avoid sharing personal items with other people in your household, like dishes, towels, and bedding. 10. Clean all surfaces that are touched often, like counters, tabletops, and doorknobs. Use household cleaning sprays or wipes according to the label instructions. [email or web address] 10/20/2018 This information is not intended to replace advice given to you by your health care provider. Make sure you discuss any questions you have with your health care provider. Document Revised: 03/24/2019 Document Reviewed: 03/24/2019 Elsevier Patient Education  2020 [employer] Inc.  Sinusitis, Adult Sinusitis is inflammation of your sinuses. Sinuses are hollow spaces in the bones around your face. Your sinuses are located:  Around your eyes.  In the middle of your forehead.  Behind your nose.  In your cheekbones. Mucus normally drains out of your sinuses. When your nasal tissues become inflamed or swollen, mucus can become  trapped or blocked. This allows bacteria, viruses, and fungi to grow, which leads to infection. Most infections of the sinuses are caused by a virus. Sinusitis can develop quickly. It can last for up to 4 weeks (acute) or for more than 12 weeks (chronic). Sinusitis often develops after a cold. What are the causes? This condition is caused by anything that creates swelling in the sinuses or stops mucus from draining. This includes:  Allergies.  Asthma.  Infection from bacteria or viruses.  Deformities or blockages in your nose or sinuses.  Abnormal growths in  the nose (nasal polyps).  Pollutants, such as chemicals or irritants in the air.  Infection from fungi (rare). What increases the risk? You are more likely to develop this condition if you:  Have a weak body defense system (immune system).  Do a lot of swimming or diving.  Overuse nasal sprays.  Smoke. What are the signs or symptoms? The main symptoms of this condition are pain and a feeling of pressure around the affected sinuses. Other symptoms include:  Stuffy nose or congestion.  Thick drainage from your nose.  Swelling and warmth over the affected sinuses.  Headache.  Upper toothache.  A cough that may get worse at night.  Extra mucus that collects in the throat or the back of the nose (postnasal drip).  Decreased sense of smell and taste.  Fatigue.  A fever.  Sore throat.  Bad breath. How is this diagnosed? This condition is diagnosed based on:  Your symptoms.  Your medical history.  A physical exam.  Tests to find out if your condition is acute or chronic. This may include: ? Checking your nose for nasal polyps. ? Viewing your sinuses using a device that has a light (endoscope). ? Testing for allergies or bacteria. ? Imaging tests, such as an MRI or CT scan. In rare cases, a bone biopsy may be done to rule out more serious types of fungal sinus disease. How is this treated? Treatment for sinusitis depends on the cause and whether your condition is chronic or acute.  If caused by a virus, your symptoms should go away on their own within 10 days. You may be given medicines to relieve symptoms. They include: ? Medicines that shrink swollen nasal passages (topical intranasal decongestants). ? Medicines that treat allergies (antihistamines). ? A spray that eases inflammation of the nostrils (topical intranasal corticosteroids). ? Rinses that help get rid of thick mucus in your nose (nasal saline washes).  If caused by bacteria, your health care  provider may recommend waiting to see if your symptoms improve. Most bacterial infections will get better without antibiotic medicine. You may be given antibiotics if you have: ? A severe infection. ? A weak immune system.  If caused by narrow nasal passages or nasal polyps, you may need to have surgery. Follow these instructions at home: Medicines  Take, use, or apply over-the-counter and prescription medicines only as told by your health care provider. These may include nasal sprays.  If you were prescribed an antibiotic medicine, take it as told by your health care provider. Do not stop taking the antibiotic even if you start to feel better. Hydrate and humidify   Drink enough fluid to keep your urine pale yellow. Staying hydrated will help to thin your mucus.  Use a cool mist humidifier to keep the humidity level in your home above 50%.  Inhale steam for 10-15 minutes, 3-4 times a day, or as told by your health care  provider. You can do this in the bathroom while a hot shower is running.  Limit your exposure to cool or dry air. Rest  Rest as much as possible.  Sleep with your head raised (elevated).  Make sure you get enough sleep each night. General instructions   Apply a warm, moist washcloth to your face 3-4 times a day or as told by your health care provider. This will help with discomfort.  Wash your hands often with soap and water to reduce your exposure to germs. If soap and water are not available, use hand sanitizer.  Do not smoke. Avoid being around people who are smoking (secondhand smoke).  Keep all follow-up visits as told by your health care provider. This is important. Contact a health care provider if:  You have a fever.  Your symptoms get worse.  Your symptoms do not improve within 10 days. Get help right away if:  You have a severe headache.  You have persistent vomiting.  You have severe pain or swelling around your face or eyes.  You have  vision problems.  You develop confusion.  Your neck is stiff.  You have trouble breathing. Summary  Sinusitis is soreness and inflammation of your sinuses. Sinuses are hollow spaces in the bones around your face.  This condition is caused by nasal tissues that become inflamed or swollen. The swelling traps or blocks the flow of mucus. This allows bacteria, viruses, and fungi to grow, which leads to infection.  If you were prescribed an antibiotic medicine, take it as told by your health care provider. Do not stop taking the antibiotic even if you start to feel better.  Keep all follow-up visits as told by your health care provider. This is important. This information is not intended to replace advice given to you by your health care provider. Make sure you discuss any questions you have with your health care provider. Document Revised: 09/07/2017 Document Reviewed: 09/07/2017 Elsevier Patient Education  2020 [employer].

## 2020-01-17 ENCOUNTER — Ambulatory Visit
Admission: RE | Admit: 2020-01-17 | Discharge: 2020-01-17 | Disposition: A | Discharge status: Home / Self Care | Payer: 59 | Source: Ambulatory Visit | Attending: Nurse Practitioner | Admitting: Nurse Practitioner

## 2020-01-17 ENCOUNTER — Ambulatory Visit
Admission: RE | Admit: 2020-01-17 | Discharge: 2020-01-17 | Disposition: A | Discharge status: Home / Self Care | Payer: 59 | Attending: Nurse Practitioner | Admitting: Nurse Practitioner

## 2020-01-17 DIAGNOSIS — U071 COVID-19: Secondary | ICD-10-CM | POA: Diagnosis present

## 2020-01-18 ENCOUNTER — Encounter: Payer: Self-pay | Admitting: Nurse Practitioner

## 2020-01-18 ENCOUNTER — Telehealth: Payer: Self-pay | Admitting: Nurse Practitioner

## 2020-01-18 NOTE — Progress Notes (Signed)
Virtual Visit via Video Note  This visit type was conducted due to national recommendations for restrictions regarding the COVID-19 pandemic (e.g. social distancing).  This format is felt to be most appropriate for this patient at this time.  All issues noted in this document were discussed and addressed.  No physical exam was performed (except for noted visual exam findings with Video Visits).   I connected with@ on 01/18/20 at  4:30 PM EDT by a video enabled telemedicine application or telephone and verified that I am speaking with the correct person using two identifiers. Location patient: home Location provider: work or home office Persons participating in the virtual visit: patient, provider  I discussed the limitations, risks, security and privacy concerns of performing an evaluation and management service by telephone and the availability of in person appointments. I also discussed with the patient that there may be a patient responsible charge related to this service. The patient expressed understanding and agreed to proceed.  Reason for visit: Follow up of Covid infection-ongoing symptoms.   HPI: This 44 year old with history of smoking, obesity, was diagnosed with Covid infection on 01/04/2020 and had the MAB infusion on 01/06/2020.    01/11/2020: Video visit:  reporting he continued to feel rundown, with intermittent headache, nasal congestion, mild cough and Tmax 100.3. He was prescribed pulse oximeter, albuterol, and symptomatic treatment. He had stopped smoking.  Slowly recovering from Covid, but weak and felt lightheaded when out of bed for short periods of time. He was given a an appt to the [facility] for in -person evaluation on 01/20/20.  Today, he reports he had a negative Covid test 01/16/2020-this morning  and has tried to go back to work yesterday,  but had to leave early because of DOE, and fatigue. He still gets tired really quickly and has no stamina.  Pulse oximeter readings  97 at rest and to 95-94-95  with walking up stairs. He feels DOE climbing stairs. No SOB at rest.  He has a lot of nasal congestion/ sinus pressure/ PND and HA and thinks he has a sinus infection. Occasional  cough- sleeping well, positive slight chest tightness, no wheezing. Afebrile since last week without medication. No GI complaints and eating and drinking well-staying well hydrated.   ROS: See pertinent positives and negatives per HPI.  Past Medical History:  Diagnosis Date  . Chicken pox   . Headache     Past Surgical History:  Procedure Laterality Date  . KNEE SURGERY Right     Family History  Problem Relation Age of Onset  . Prostate cancer Paternal Grandmother   . Heart disease Father   . Cancer Brother   . Early death Brother   . Cancer Maternal Grandmother   . Asthma Brother     SOCIAL HX: Smokes 1/2 ppd on and off 30 years   Current Outpatient Medications:  .  albuterol (VENTOLIN HFA) 108 (90 Base) MCG/ACT inhaler, Inhale 2 puffs into the lungs every 6 (six) hours as needed for wheezing or shortness of breath., Disp: 8 g, Rfl: 0 .  cyclobenzaprine (FLEXERIL) 10 MG tablet, Take 1 tablet (10 mg total) by mouth at bedtime as needed for muscle spasms., Disp: 30 tablet, Rfl: 0 .  amoxicillin-clavulanate (AUGMENTIN) 875-125 MG tablet, Take 1 tablet by mouth 2 (two) times daily., Disp: 20 tablet, Rfl: 0 .  guaiFENesin (MUCINEX) 600 MG 12 hr tablet, Take 2 tablets (1,200 mg total) by mouth 2 (two) times daily as needed for up  to 7 days for cough or to loosen phlegm., Disp: 28 tablet, Rfl: 0 .  predniSONE (DELTASONE) 10 MG tablet, Take 6 tablets ( total 60 mg) by mouth today; take 5 tablets ( total 50 mg) by mouth tomorrow, and continue to decrease by 1 tablet (10 mg) every day until off. This is a 6-5-4-3-2-1- off taper., Disp: 21 tablet, Rfl: 0  EXAM:  VITALS per patient if applicable: HR 108 . Pulse oximeter 97% at rest 95% climbing steps.   GENERAL: alert, oriented,  appears less fatigued than  Last week, but still fatigued. He is walking around the house during the video and monitoring pulse ox. No acute distress.  HEENT: atraumatic, conjunctiva clear, no obvious abnormalities on inspection of external nose and ears. Positive nasal congested tone to voice.   NECK: normal movements of the head and neck  LUNGS: on inspection no signs of respiratory distress, breathing rate appears normal, no obvious gross SOB, gasping or wheezing. Speaking in complete sentences. No cough during the video.   CV: no obvious cyanosis  MS: moves all visible extremities without noticeable abnormality  PSYCH/NEURO: pleasant and cooperative, no obvious depression or anxiety, speech and thought processing grossly intact  ASSESSMENT AND PLAN:  Discussed the following assessment and plan:  COVID-19 virus infection - Plan: DG Chest 2 View  Acute non-recurrent sinusitis, unspecified location  No problem-specific Assessment & Plan notes found for this encounter.  This 44 year old with history of smoking, obesity, was diagnosed with Covid infection on 01/04/2020 and had the MAB infusion on 01/06/2020.  He tested negative for Covid this am 01/16/2020.   Recommend  CXR today at [facility] entrance. Checking the lungs d/t continued c/o DOE. No SOB at rest. Pulse oximeter is 97% rest and 95-94% climbing stairs in the video. Still very fatigued with poor energy. No CP or calf/leg pain or welling. Sinus congestion/pressure and HA- suspect secondary sinus infection.  Patient advised:   Please get your CXR today at [facility] entrance I am sending 3 prescriptions to your pharmacy:   1. Prednisone in a 6 day  tapering  dose:   Take 6 tablets all at once today,  then 5 tablets all at once tomorrow, and 4 tablets on day three and continue to reduce dose by 1 tablet daily until gone.  This is a 6-5-4-3-2-1- off taper.    2. Augmentin 875 mg twice daily for 10 days for sinus  infection   3. Mucinex 1200 mg twice daily to thin secretions.  Use the Albuterol inhaler:  2 puffs every 6 hours as needed for wheezing. I refilled your inhaler.   4. Monitor pulse oximeter If your oxygen level falls below 90% and stays there, go to the ER  for urgent assistance. If it drops and stays at 94%- you need to be seen at an Urgent Care office for in-person evaluation.   5. Call us in 2 days for symptoms update.  Seek care sooner if feeling worse. Rest, drink a lot more fluids to thin nasal secretions so can clear out secretions.   6. Go to the [facility] as planned on Fri- even if feeling better.  7. Continue isolation for a minimum of 10 days.  All symptoms should be improving or resolved in order to break quarantine AND you have no fever for 3 days without using Tylenol or Advil or any med to lower fever.   I discussed the assessment and treatment plan with the patient.  The patient was provided an opportunity to ask questions and all were answered. The patient agreed with the plan and demonstrated an understanding of the instructions.   The patient was advised to call back or seek an in-person evaluation if the symptoms worsen or if the condition fails to improve as anticipated.  Amedeo Kinsman, NP Adult Nurse Practitioner [facility] [employer] [phone]

## 2020-01-18 NOTE — Telephone Encounter (Signed)
He goes to the Covid clinic for in person exam on Fri.   Can we set up a video visit for early next week?

## 2020-01-18 NOTE — Telephone Encounter (Signed)
I called and LMOM that I would like to talk to him today. I sent a Mychart message as well since he communicates through My chart.

## 2020-01-18 NOTE — Telephone Encounter (Signed)
Patient replied back via mychart; I sent message to you.

## 2020-01-19 ENCOUNTER — Other Ambulatory Visit: Payer: Self-pay

## 2020-01-19 ENCOUNTER — Telehealth: Payer: Self-pay | Admitting: Nurse Practitioner

## 2020-01-19 ENCOUNTER — Ambulatory Visit
Admission: EM | Admit: 2020-01-19 | Discharge: 2020-01-19 | Disposition: A | Discharge status: Home / Self Care | Payer: 59 | Attending: Internal Medicine | Admitting: Internal Medicine

## 2020-01-19 DIAGNOSIS — R002 Palpitations: Secondary | ICD-10-CM | POA: Diagnosis not present

## 2020-01-19 LAB — CBC WITH DIFFERENTIAL/PLATELET
Abs Immature Granulocytes: 0.06 10*3/uL (ref 0.00–0.07)
Basophils Absolute: 0 10*3/uL (ref 0.0–0.1)
Basophils Relative: 0 %
Eosinophils Absolute: 0 10*3/uL (ref 0.0–0.5)
Eosinophils Relative: 0 %
HCT: 46.2 % (ref 39.0–52.0)
Hemoglobin: 15.5 g/dL (ref 13.0–17.0)
Immature Granulocytes: 1 %
Lymphocytes Relative: 15 %
Lymphs Abs: 1.4 10*3/uL (ref 0.7–4.0)
MCH: 27.9 pg (ref 26.0–34.0)
MCHC: 33.5 g/dL (ref 30.0–36.0)
MCV: 83.1 fL (ref 80.0–100.0)
Monocytes Absolute: 0.5 10*3/uL (ref 0.1–1.0)
Monocytes Relative: 6 %
Neutro Abs: 6.9 10*3/uL (ref 1.7–7.7)
Neutrophils Relative %: 78 %
Platelets: 244 10*3/uL (ref 150–400)
RBC: 5.56 MIL/uL (ref 4.22–5.81)
RDW: 13.4 % (ref 11.5–15.5)
WBC: 8.9 10*3/uL (ref 4.0–10.5)
nRBC: 0 % (ref 0.0–0.2)

## 2020-01-19 LAB — COMPREHENSIVE METABOLIC PANEL
ALT: 45 U/L — ABNORMAL HIGH (ref 0–44)
AST: 27 U/L (ref 15–41)
Albumin: 3.9 g/dL (ref 3.5–5.0)
Alkaline Phosphatase: 65 U/L (ref 38–126)
Anion gap: 9 (ref 5–15)
BUN: 17 mg/dL (ref 6–20)
CO2: 26 mmol/L (ref 22–32)
Calcium: 8.8 mg/dL — ABNORMAL LOW (ref 8.9–10.3)
Chloride: 104 mmol/L (ref 98–111)
Creatinine, Ser: 0.79 mg/dL (ref 0.61–1.24)
GFR calc Af Amer: >60 mL/min (ref >60)
GFR calc non Af Amer: >60 mL/min (ref >60)
Glucose, Bld: 135 mg/dL — ABNORMAL HIGH (ref 70–99)
Potassium: 4.1 mmol/L (ref 3.5–5.1)
Sodium: 139 mmol/L (ref 135–145)
Total Bilirubin: 0.8 mg/dL (ref 0.3–1.2)
Total Protein: 7.3 g/dL (ref 6.5–8.1)

## 2020-01-19 LAB — FIBRIN DERIVATIVES D-DIMER (ARMC ONLY): Fibrin derivatives D-dimer (ARMC): 167.17 ng/mL (FEU) (ref 0.00–499.00)

## 2020-01-19 LAB — MAGNESIUM: Magnesium: 2.2 mg/dL (ref 1.7–2.4)

## 2020-01-19 NOTE — Telephone Encounter (Signed)
I spoke with Taylor Dalton and his wife. He was advised to go to [facility] for evaluation of Covid  DOE and exertional  tachycardia. He reports HR up 130-140 with activity.  He is in agreement.   He needs a work note excusing him from 01/15/2020 through this week. Provided letter to his My Chart.

## 2020-01-19 NOTE — ED Triage Notes (Signed)
Pt reports being referred here by PCP. Pt sts heart has being racing (highest rate 180 at rest). Also reports having dizziness, mild chest pain and shortness of breath.  covid + on Sept 15. 2 neg test since then.  Chest xray done on Tuesday and was neg.

## 2020-01-19 NOTE — Discharge Instructions (Signed)
Go to the ER if you have pulse of 120 or more and have chest pain and or SOB Follow up with cardiology because you need a holter monitor.

## 2020-01-19 NOTE — ED Provider Notes (Signed)
[facility]    CSN: [id number] Arrival date & time: 01/19/20  1123      History   Chief Complaint Chief Complaint  Patient presents with  . Dizziness    HPI Taylor Dalton is a 44 y.o. male who presents with experiencing chest pressure and right after a few minutes his pulse speeds up, and has been up to 180. He recorded his pulse ox for his PCP and me to see what has been going on, and one of those recordings shows while walking his pulse raised to 127 and pulse ox dropped to 88. That is when he felt light headed like he is going to faint.  9/15 had + covid and on 9/20 started having tachy.  Had negative 9/22 and another one 9/27 was also neg.  Had negative CXR 2 days ago after having virtual visit with PCP on 9/27 and was placed on steroid and antibiotics on  9/27.  Had had Covid vaccine in March Has been lying around while with covid. His PCP called him to check on him and advised him to come here for work up.     Past Medical History:  Diagnosis Date  . Chicken pox   . Headache     Patient Active Problem List   Diagnosis Date Noted  . Acute non-recurrent sinusitis 01/16/2020  . COVID-19 virus infection 01/11/2020  . Nicotine dependence due to vaping tobacco product 01/11/2020  . Encounter for medical examination to establish care 11/29/2019  . Body mass index (BMI) 32.0-32.9, adult 11/29/2019  . Tobacco dependence 11/29/2019  . Unstable angina (HCC)   . Hyperlipidemia 10/03/2016  . Muscle spasm 10/03/2016  . Obesity (BMI 30.0-34.9) 01/15/2015  . OSA (obstructive sleep apnea) 01/15/2015  . Atypical chest pain 01/15/2015    Past Surgical History:  Procedure Laterality Date  . KNEE SURGERY Right        Home Medications    Prior to Admission medications   Medication Sig Start Date End Date Taking? Authorizing Provider  albuterol (VENTOLIN HFA) 108 (90 Base) MCG/ACT inhaler Inhale 2 puffs into the lungs every 6 (six) hours as needed for wheezing  or shortness of breath. 01/16/20   Theadore Nan, NP  amoxicillin-clavulanate (AUGMENTIN) 875-125 MG tablet Take 1 tablet by mouth 2 (two) times daily. 01/16/20   Theadore Nan, NP  cyclobenzaprine (FLEXERIL) 10 MG tablet Take 1 tablet (10 mg total) by mouth at bedtime as needed for muscle spasms. 10/03/16   Tommie Sams, DO  guaiFENesin (MUCINEX) 600 MG 12 hr tablet Take 2 tablets (1,200 mg total) by mouth 2 (two) times daily as needed for up to 7 days for cough or to loosen phlegm. 01/16/20 01/23/20  Theadore Nan, NP  predniSONE (DELTASONE) 10 MG tablet Take 6 tablets ( total 60 mg) by mouth today; take 5 tablets ( total 50 mg) by mouth tomorrow, and continue to decrease by 1 tablet (10 mg) every day until off. This is a 6-5-4-3-2-1- off taper. 01/16/20   Theadore Nan, NP    Family History Family History  Problem Relation Age of Onset  . Prostate cancer Paternal Grandmother   . Heart disease Father   . Cancer Brother   . Early death Brother   . Cancer Maternal Grandmother   . Asthma Brother     Social History Social History   Tobacco Use  . Smoking status: Current Some Day Smoker    Packs/day: 0.50    Types:  Cigarettes  . Smokeless tobacco: Never Used  . Tobacco comment: On and Off for 30 years.   Vaping Use  . Vaping Use: Every day  Substance Use Topics  . Alcohol use: Yes    Alcohol/week: 2.0 - 3.0 standard drinks    Types: 2 - 3 Standard drinks or equivalent per week  . Drug use: No     Allergies   Patient has no known allergies.   Review of Systems Review of Systems  Constitutional: Negative for activity change, appetite change, diaphoresis, fatigue and fever.  HENT: Negative for congestion, ear discharge, ear pain, postnasal drip, rhinorrhea and sore throat.   Eyes: Negative for discharge.  Respiratory: Positive for cough and chest tightness. Negative for shortness of breath and wheezing.   Cardiovascular: Positive for chest pain and palpitations.  Negative for leg swelling.  Gastrointestinal: Negative for abdominal pain, diarrhea, nausea and vomiting.  Musculoskeletal: Negative for gait problem and myalgias.  Skin: Negative for rash.  Neurological: Positive for dizziness. Negative for syncope, weakness, numbness and headaches.     Physical Exam Triage Vital Signs ED Triage Vitals  Enc Vitals Group     BP 01/19/20 1246 134/81     Pulse Rate 01/19/20 1246 85     Resp 01/19/20 1246 18     Temp 01/19/20 1246 98.7 F (37.1 C)     Temp Source 01/19/20 1246 Oral     SpO2 01/19/20 1246 97 %     Weight 01/19/20 1247 250 lb (113.4 kg)     Height 01/19/20 1247 6\' 3"  (1.905 m)     Head Circumference --      Peak Flow --      Pain Score 01/19/20 1246 2     Pain Loc --      Pain Edu? --      Excl. in GC? --    No data found.  Updated Vital Signs BP 134/81   Pulse 85   Temp 98.7 F (37.1 C) (Oral)   Resp 18   Ht 6\' 3"  (1.905 m)   Wt 250 lb (113.4 kg)   SpO2 97%   BMI 31.25 kg/m   Visual Acuity Right Eye Distance:   Left Eye Distance:   Bilateral Distance:    Right Eye Near:   Left Eye Near:    Bilateral Near:     Physical Exam Physical Exam Vitals signs and nursing note reviewed.  Constitutional:      General: he is not in acute distress.    Appearance: Normal appearance. he is not ill-appearing, toxic-appearing or diaphoretic.  HENT:     Head: Normocephalic.     Right Ear: Tympanic membrane, ear canal and external ear normal.     Left Ear: Tympanic membrane, ear canal and external ear normal.     Nose: Nose normal.     Mouth/Throat:     Mouth: Mucous membranes are moist.  Eyes:     General: No scleral icterus.       Right eye: No discharge.        Left eye: No discharge.     Conjunctiva/sclera: Conjunctivae normal.  Neck:     Musculoskeletal: Neck supple. No neck rigidity.  Cardiovascular:     Rate and Rhythm: Normal rate and regular rhythm.     Heart sounds: No murmur.  Pulmonary:     Effort:  Pulmonary effort is normal.     Breath sounds: Normal breath sounds.  Abdominal:  General: Bowel sounds are normal. There is no distension.     Palpations: Abdomen is soft. There is no mass.     Tenderness: There is no abdominal tenderness. There is no guarding or rebound.     Hernia: No hernia is present.  Musculoskeletal: Normal range of motion.  Lymphadenopathy:     Cervical: No cervical adenopathy.  Skin:    General: Skin is warm and dry.     Coloration: Skin is not jaundiced.     Findings: No rash.  Neurological:     Mental Status: he is alert and oriented to person, place, and time.     Gait: Gait normal.  Psychiatric:        Mood and Affect: Mood normal.        Behavior: Behavior normal.        Thought Content: Thought content normal.        Judgment: Judgment normal.     UC Treatments / Results  Labs (all labs ordered are listed, but only abnormal results are displayed) Labs Reviewed  COMPREHENSIVE METABOLIC PANEL - Abnormal; Notable for the following components:      Result Value   Glucose, Bld 135 (*)    Calcium 8.8 (*)    ALT 45 (*)    All other components within normal limits  FIBRIN DERIVATIVES D-DIMER ([facility] ONLY)  CBC WITH DIFFERENTIAL/PLATELET  MAGNESIUM    EKG NSR, normal EKG  Radiology No results found.  Procedures Procedures (including critical care time)  Medications Ordered in UC Medications - No data to display  Initial Impression / Assessment and Plan / UC Course  I have reviewed the triage vital signs and the nursing notes. Palpitations with bouts of tachycardia intermittently, but not present today so far. Pertinent labs & imaging results that were available during my care of the patient were reviewed by me and considered in my medical decision making (see chart for details). Advised to increase his calcium intake since his calcium is a little low. Needs to Fu with cardiology or if he has recurrence, needs to go to ER.   Final  Clinical Impressions(s) / UC Diagnoses   Final diagnoses:  Palpitations     Discharge Instructions     Go to the ER if you have pulse of 120 or more and have chest pain and or SOB Follow up with cardiology because you need a holter monitor.     ED Prescriptions    None     PDMP not reviewed this encounter.   Garey Ham, PA-C 01/19/20 1511

## 2020-01-20 ENCOUNTER — Ambulatory Visit (INDEPENDENT_AMBULATORY_CARE_PROVIDER_SITE_OTHER): Payer: 59 | Admitting: Nurse Practitioner

## 2020-01-20 VITALS — BP 132/74 | HR 97 | Temp 97.1°F | Ht 75.0 in | Wt 261.0 lb

## 2020-01-20 DIAGNOSIS — Z8616 Personal history of COVID-19: Secondary | ICD-10-CM

## 2020-01-20 DIAGNOSIS — R000 Tachycardia, unspecified: Secondary | ICD-10-CM

## 2020-01-20 NOTE — Assessment & Plan Note (Addendum)
Stay well hydrated  Stay active  Deep breathing exercises  May take tylenol or fever or pain  Tachycardia:  Assessment: seen in ED yesterday. EKG showed NSR. Tachycardia could be related to recent prescription of steroids.   Plan:  Stop prednisone  Will place referral to cardiology - may need zio patch     Follow up:  Follow up in 4 weeks or sooner if needed

## 2020-01-20 NOTE — Patient Instructions (Addendum)
Covid 19:   Stay well hydrated  Stay active  Deep breathing exercises  May take tylenol or fever or pain  Will place referral to cardiology   Follow up:  Follow up in 4 weeks or sooner if needed

## 2020-01-20 NOTE — Progress Notes (Signed)
@Patient  ID: , male    DOB: April 09, 1976, 44 y.o.   MRN: [id number]  Chief Complaint  Patient presents with  . New Patient (Initial Visit)    COVID Pos: 9/15 Infusion: 9/16 PCP: 9/27 was given Amoxicillin and Prednisone  . Shortness of Breath  . Tachycardia    Referring provider: 10/27, NP   44 year old male with history of stable angina, OSA, hyperlipidemia.  Diagnosed with Covid on 01/04/2020.  HPI  Patient presents today for post COVID care clinic visit.  He was diagnosed with Covid on 01/04/2020.  He did get monoclonal antibody infusion on 01/05/2020.  Patient was seen by primary care physician on 01/16/2020 and prescribed Augmentin and prednisone.  He was seen in the ED yesterday for tachycardia.  EKG was normal.  Chest x-ray was clear.  Patient was sent home he has been checking his O2 sats.  He states that his heart rate is jumping up between 140s to 180s and oxygen level is dropping into the 80s at times.  This occurs intermittently.  Patient stable in the office today.  He was walked in the office today and O2 sats dropped to 91% on room air and heart rate was stable at around 105 bpm.  Denies f/c/s, n/v/d, hemoptysis, PND, chest pain or edema.      No Known Allergies  Immunization History  Administered Date(s) Administered  . PFIZER SARS-COV-2 Vaccination 07/15/2019, 08/16/2019    Past Medical History:  Diagnosis Date  . Chicken pox   . Headache     Tobacco History: Social History   Tobacco Use  Smoking Status Current Some Day Smoker  . Packs/day: 0.50  . Types: Cigarettes  Smokeless Tobacco Never Used  Tobacco Comment   On and Off for 30 years.    Ready to quit: Not Answered Counseling given: Not Answered Comment: On and Off for 30 years.    Outpatient Encounter Medications as of 01/20/2020  Medication Sig  . albuterol (VENTOLIN HFA) 108 (90 Base) MCG/ACT inhaler Inhale 2 puffs into the lungs every 6 (six) hours as needed for  wheezing or shortness of breath.  03/21/2020 amoxicillin-clavulanate (AUGMENTIN) 875-125 MG tablet Take 1 tablet by mouth 2 (two) times daily.  . cyclobenzaprine (FLEXERIL) 10 MG tablet Take 1 tablet (10 mg total) by mouth at bedtime as needed for muscle spasms.  Marland Kitchen guaiFENesin (MUCINEX) 600 MG 12 hr tablet Take 2 tablets (1,200 mg total) by mouth 2 (two) times daily as needed for up to 7 days for cough or to loosen phlegm.  . predniSONE (DELTASONE) 10 MG tablet Take 6 tablets ( total 60 mg) by mouth today; take 5 tablets ( total 50 mg) by mouth tomorrow, and continue to decrease by 1 tablet (10 mg) every day until off. This is a 6-5-4-3-2-1- off taper.   No facility-administered encounter medications on file as of 01/20/2020.     Review of Systems  Review of Systems  Constitutional: Negative.  Negative for fatigue and fever.  HENT: Negative.   Respiratory: Positive for shortness of breath. Negative for cough.   Cardiovascular: Positive for palpitations. Negative for chest pain and leg swelling.  Gastrointestinal: Negative.   Allergic/Immunologic: Negative.   Neurological: Negative.   Psychiatric/Behavioral: Negative.        Physical Exam  BP 132/74 (BP Location: Right Arm)   Pulse 97   Temp (!) 97.1 F (36.2 C)   Ht 6\' 3"  (1.905 m)   Wt 261 lb 0.1 oz (  118.4 kg)   SpO2 98%   BMI 32.62 kg/m   Wt Readings from Last 5 Encounters:  01/20/20 261 lb 0.1 oz (118.4 kg)  01/19/20 250 lb (113.4 kg)  01/16/20 250 lb (113.4 kg)  01/11/20 250 lb (113.4 kg)  11/29/19 260 lb (117.9 kg)     Physical Exam Vitals and nursing note reviewed.  Constitutional:      General: He is not in acute distress.    Appearance: He is well-developed.  Cardiovascular:     Rate and Rhythm: Normal rate and regular rhythm.  Pulmonary:     Effort: Pulmonary effort is normal.     Breath sounds: Normal breath sounds.  Musculoskeletal:     Right lower leg: No edema.     Left lower leg: No edema.  Skin:     General: Skin is warm and dry.  Neurological:     Mental Status: He is alert and oriented to person, place, and time.  Psychiatric:        Mood and Affect: Mood normal.        Behavior: Behavior normal.      Imaging: DG Chest 2 View  Result Date: 01/17/2020 CLINICAL DATA:  Shortness of breath, history of prior COVID-19 infection EXAM: CHEST - 2 VIEW COMPARISON:  12/04/2016 FINDINGS: The heart size and mediastinal contours are within normal limits. Both lungs are clear. The visualized skeletal structures are unremarkable. IMPRESSION: No active cardiopulmonary disease. Electronically Signed   By: Alcide Clever M.D.   On: 01/17/2020 10:46   MR SHOULDER RIGHT W CONTRAST  Result Date: 12/21/2019 CLINICAL DATA:  Intermittent right shoulder pain for the past 3-4 months. Prior injury 10 years ago after a fall. EXAM: MR ARTHROGRAM OF THE RIGHT SHOULDER TECHNIQUE: Multiplanar, multisequence MR imaging of the right shoulder was performed following the administration of intra-articular contrast. CONTRAST:  See Injection Documentation. COMPARISON:  MR arthrogram of the right shoulder dated April 02, 2016. FINDINGS: Rotator cuff:  Intact without significant tendinosis. Muscles:  No focal muscular atrophy or edema. Biceps long head:  Intact and normally positioned. Acromioclavicular Joint: The acromion is type I. There are minimal acromioclavicular degenerative changes. No significant fluid or contrast is present in the subacromial/subdeltoid bursa. Glenohumeral Joint: Distended with intra-articular contrast. No chondral defect. Labrum:  No evidence of labral tear. Bones: No acute or significant extra-articular osseous findings. Other: No significant soft tissue findings. IMPRESSION: 1. No labral or rotator cuff tear. 2. Minimal acromioclavicular osteoarthritis. Electronically Signed   By: Obie Dredge M.D.   On: 12/21/2019 14:56   DG FLUORO GUIDED NEEDLE PLC ASPIRATION/INJECTION LOC  Result Date:  12/21/2019 CLINICAL DATA:  Right shoulder pain for 3.5 months. EXAM: RIGHT SHOULDER ARTHROGRAM UNDER FLUOROSCOPY FLUOROSCOPY TIME:  Fluoroscopy Time:  0.1 minute Radiation Exposure Index (if provided by the fluoroscopic device): 0.2 mGy Number of Acquired Spot Images: 0 PROCEDURE: The risks and benefits of the procedure were discussed with the patient, and written informed consent was obtained. The patient stated no history of allergy to contrast media. A formal timeout procedure was performed with the patient according to departmental protocol. The patient was placed supine on the fluoroscopy table and the right glenohumeral joint was identified under fluoroscopy. The skin overlying the right glenohumeral joint was subsequently cleaned with Chloraprep and a sterile drape was placed over the area of interest. 5 ml 1% Lidocaine was used to anesthetize the skin around the needle insertion site. A 22 gauge spinal needle was inserted into  the right glenohumeral joint under fluoroscopy. Position was confirmed with injection of less than 70ml of Omnipaque 180 under fluoroscopy. 12 ml of gadolinium mixture (0.05 mL of Gadavist mixed with 10 mL sterile saline and 10 mL Omnipaque 180) was injected into the right glenohumeral joint. The needle was removed and hemostasis was achieved. The patient was subsequently transferred to MRI for imaging. IMPRESSION: Technically successful right shoulder arthrogram prior to MRI under fluoroscopy. Electronically Signed   By: Elige Ko   On: 12/21/2019 11:22     Assessment & Plan:   History of COVID-19 Stay well hydrated  Stay active  Deep breathing exercises  May take tylenol or fever or pain  Tachycardia:  Assessment: seen in ED yesterday. EKG showed NSR. Tachycardia could be related to recent prescription of steroids.   Plan:  Stop prednisone  Will place referral to cardiology - may need zio patch     Follow up:  Follow up in 4 weeks or sooner if  needed       Ivonne Andrew, NP 01/20/2020

## 2020-02-03 ENCOUNTER — Ambulatory Visit (INDEPENDENT_AMBULATORY_CARE_PROVIDER_SITE_OTHER): Payer: 59 | Admitting: Cardiology

## 2020-02-03 ENCOUNTER — Encounter: Payer: Self-pay | Admitting: Cardiology

## 2020-02-03 ENCOUNTER — Ambulatory Visit (INDEPENDENT_AMBULATORY_CARE_PROVIDER_SITE_OTHER): Payer: 59

## 2020-02-03 ENCOUNTER — Other Ambulatory Visit: Payer: Self-pay

## 2020-02-03 VITALS — BP 110/70 | HR 79 | Ht 74.0 in | Wt 263.0 lb

## 2020-02-03 DIAGNOSIS — R000 Tachycardia, unspecified: Secondary | ICD-10-CM | POA: Diagnosis not present

## 2020-02-03 DIAGNOSIS — E7800 Pure hypercholesterolemia, unspecified: Secondary | ICD-10-CM

## 2020-02-03 DIAGNOSIS — R072 Precordial pain: Secondary | ICD-10-CM

## 2020-02-03 DIAGNOSIS — F17200 Nicotine dependence, unspecified, uncomplicated: Secondary | ICD-10-CM | POA: Diagnosis not present

## 2020-02-03 DIAGNOSIS — R079 Chest pain, unspecified: Secondary | ICD-10-CM | POA: Diagnosis not present

## 2020-02-03 DIAGNOSIS — R0600 Dyspnea, unspecified: Secondary | ICD-10-CM

## 2020-02-03 MED ORDER — METOPROLOL TARTRATE 100 MG PO TABS: ORAL_TABLET | ORAL | 0 refills | Status: DC

## 2020-02-03 NOTE — Progress Notes (Signed)
Cardiology Office Note:    Date:  02/03/2020   ID:  Taylor Dalton, DOB 1976-01-12, MRN [id number]  PCP:  Marval Regal, NP  [facility] Cardiologist:  Kate Sable, MD  [place] Electrophysiologist:  None   Referring MD: Fenton Foy, NP   Chief Complaint  Patient presents with  . New Patient (Initial Visit)    Patient c.o SOB and Chest pain. Meds reviewed verbally with paitent.    Zahki Hoogendoorn is a 44 y.o. male who is being seen today for the evaluation of chest pain at the request of Fenton Foy, NP.   History of Present Illness:    Dolton Shaker is a 44 y.o. male with a hx of hyperlipidemia, COVID-19 infection 12/2019, current smoker x30 years who presents due to chest pain, shortness of breath and elevated heart rates.  Patient states not feeling well since being diagnosed with Covid last month.  He endorses symptoms of shortness of breath which has caused him to miss some time from work.  Shortness of breath is usually not related with exertion.  When he gets out of breath, dizzy usually falls with chest tightness lasting a couple of minutes.  Denies dizziness.  Ambulation causes his heart rates to be elevated to as high as 180s beats per minute.  His oxygen saturations sometimes drop to below 90s with ambulation.  He states his father passed suddenly at age 8, not sure why.  Past Medical History:  Diagnosis Date  . Chicken pox   . Headache     Past Surgical History:  Procedure Laterality Date  . KNEE SURGERY Right     Current Medications: Current Meds  Medication Sig  . albuterol (VENTOLIN HFA) 108 (90 Base) MCG/ACT inhaler Inhale 2 puffs into the lungs every 6 (six) hours as needed for wheezing or shortness of breath.  . cyclobenzaprine (FLEXERIL) 10 MG tablet Take 1 tablet (10 mg total) by mouth at bedtime as needed for muscle spasms.     Allergies:   Patient has no known allergies.   Social History   Socioeconomic History  . Marital  status: Married    Spouse name: Not on file  . Number of children: Not on file  . Years of education: Not on file  . Highest education level: Not on file  Occupational History  . Occupation: Banker [employer]  Tobacco Use  . Smoking status: Current Some Day Smoker    Packs/day: 0.50    Types: Cigarettes  . Smokeless tobacco: Never Used  . Tobacco comment: On and Off for 30 years.   Vaping Use  . Vaping Use: Every day  Substance and Sexual Activity  . Alcohol use: Yes    Alcohol/week: 2.0 - 3.0 standard drinks    Types: 2 - 3 Standard drinks or equivalent per week  . Drug use: No  . Sexual activity: Yes  Other Topics Concern  . Not on file  Social History Narrative   Married lives locally, No pets, 6 children and 4 adults still living with them.    Social Determinants of Health   Financial Resource Strain:   . Difficulty of Paying Living Expenses: Not on file  Food Insecurity:   . Worried About Charity fundraiser in the Last Year: Not on file  . Ran Out of Food in the Last Year: Not on file  Transportation Needs:   . Lack of Transportation (Medical): Not on file  . Lack of Transportation (  Non-Medical): Not on file  Physical Activity:   . Days of Exercise per Week: Not on file  . Minutes of Exercise per Session: Not on file  Stress:   . Feeling of Stress : Not on file  Social Connections:   . Frequency of Communication with Friends and Family: Not on file  . Frequency of Social Gatherings with Friends and Family: Not on file  . Attends Religious Services: Not on file  . Active Member of Clubs or Organizations: Not on file  . Attends Archivist Meetings: Not on file  . Marital Status: Not on file     Family History: The patient's family history includes Asthma in his brother; Cancer in his brother and maternal grandmother; Early death in his brother; Heart disease in his father; Prostate cancer in his paternal grandmother.  ROS:   Please  see the history of present illness.     All other systems reviewed and are negative.  EKGs/Labs/Other Studies Reviewed:    The following studies were reviewed today:   EKG:  EKG is  ordered today.  The ekg ordered today demonstrates normal sinus rhythm, normal ECG.  Recent Labs: 11/29/2019: TSH 1.86 01/19/2020: ALT 45; BUN 17; Creatinine, Ser 0.79; Hemoglobin 15.5; Magnesium 2.2; Platelets 244; Potassium 4.1; Sodium 139  Recent Lipid Panel    Component Value Date/Time   CHOL 231 (H) 11/29/2019 1116   TRIG 216.0 (H) 11/29/2019 1116   HDL 43.50 11/29/2019 1116   CHOLHDL 5 11/29/2019 1116   VLDL 43.2 (H) 11/29/2019 1116   LDLCALC 129 (H) 12/05/2016 0400   LDLDIRECT 152.0 11/29/2019 1116     Risk Assessment/Calculations:      Physical Exam:    VS:  BP 110/70 (BP Location: Right Arm, Patient Position: Sitting, Cuff Size: Normal)   Pulse 79   Ht 6' 2"  (1.88 m)   Wt 263 lb (119.3 kg)   SpO2 98%   BMI 33.77 kg/m     Wt Readings from Last 3 Encounters:  02/03/20 263 lb (119.3 kg)  01/20/20 261 lb 0.1 oz (118.4 kg)  01/19/20 250 lb (113.4 kg)     GEN:  Well nourished, well developed in no acute distress HEENT: Normal NECK: No JVD; No carotid bruits LYMPHATICS: No lymphadenopathy CARDIAC: RRR, no murmurs, rubs, gallops RESPIRATORY:  Clear to auscultation without rales, wheezing or rhonchi  ABDOMEN: Soft, non-tender, non-distended MUSCULOSKELETAL:  No edema; No deformity  SKIN: Warm and dry NEUROLOGIC:  Alert and oriented x 3 PSYCHIATRIC:  Normal affect   ASSESSMENT:    1. Chest pain of uncertain etiology   2. Dyspnea, unspecified type   3. Pure hypercholesterolemia   4. Smoking   5. Tachycardia   6. Precordial pain    PLAN:    In order of problems listed above:  1. Patient with symptoms of chest pain.  Has risk factors of hyperlipidemia, current smoker.  Will evaluate patient with echocardiogram.  He is low to intermediate risk, get coronary CTA to evaluate  presence of CAD. 2. Patient with nonspecific shortness of breath, sometimes associated with hypoxia.  Echocardiogram as above.  If cardiac work-up is normal, consider pulmonary etiology in light of recent COVID-19 infection and also current smoker. 3. History of hyperlipidemia, 10-year ASCVD risk 6.2%.  Not in statin benefit group.  Low-cholesterol diet advised. 4. Patient is a current smoker, smoking cessation recommended. 5. Patient with inappropriate tachycardia, with ambulation.  Place a cardiac monitor x1 week to evaluate any other  arrhythmias.  If heart rates still persistently elevated, trial of beta-blockade will be considered.  Follow-up after echo, CT, monitor.  Total encounter time more than 80 minutes  Greater than 50% was spent in counseling and coordination of care with the patient    Medication Adjustments/Labs and Tests Ordered: Current medicines are reviewed at length with the patient today.  Concerns regarding medicines are outlined above.  Orders Placed This Encounter  Procedures  . CT CORONARY MORPH W/CTA COR W/SCORE W/CA W/CM &/OR WO/CM  . CT CORONARY FRACTIONAL FLOW RESERVE DATA PREP  . CT CORONARY FRACTIONAL FLOW RESERVE FLUID ANALYSIS  . LONG TERM MONITOR (3-14 DAYS)  . EKG 12-Lead  . ECHOCARDIOGRAM COMPLETE   Meds ordered this encounter  Medications  . metoprolol tartrate (LOPRESSOR) 100 MG tablet    Sig: Take 1 tablet (100 mg) by mouth 2 hour prior to your cardiac cta x 1 dose    Dispense:  1 tablet    Refill:  0    Patient Instructions  Medication Instructions:  - Your physician recommends that you continue on your current medications as directed. Please refer to the Current Medication list given to you today.  *If you need a refill on your cardiac medications before your next appointment, please call your pharmacy*   Lab Work: - none ordered  If you have labs (blood work) drawn today and your tests are completely normal, you will receive your  results only by: Marland Kitchen MyChart Message (if you have MyChart) OR . A paper copy in the mail If you have any lab test that is abnormal or we need to change your treatment, we will call you to review the results.   Testing/Procedures:  1) Echocardiogram:  - Your physician has requested that you have an echocardiogram (after 02/10/20). Echocardiography is a painless test that uses sound waves to create images of your heart. It provides your doctor with information about the size and shape of your heart and how well your heart's chambers and valves are working. This procedure takes approximately one hour. There are no restrictions for this procedure. There is a possibility that an IV may need to be started during your test to inject an image enhancing agent. This is done to obtain more optimal pictures of your heart. Therefore we ask that you do at least drink some water prior to coming in to hydrate your veins.   2) Cardiac CT  - Your physician has requested that you have cardiac CT. Cardiac computed tomography (CT) is a painless test that uses an x-ray machine to take clear, detailed pictures of your heart.   Your cardiac CT will be scheduled at one of the below locations:   [facility] [address] [place], [place] [address] [phone]  [place] [address] [place], [place] [address] [phone]  If scheduled at [facility], please arrive at the [facility] main entrance of [facility] 30 minutes prior to test start time. Proceed to the [facility] Radiology Department (first floor) to check-in and test prep.  If scheduled at [facility], please arrive 15 mins early for check-in and test prep.  Please follow these instructions carefully (unless otherwise directed):  Hold all erectile dysfunction medications at least 3 days (72 hrs) prior to test.  On the Night Before the  Test: . Be sure to Drink plenty of water. . Do not consume any caffeinated/decaffeinated beverages  or chocolate 12 hours prior to your test. . Do not take any antihistamines 12 hours prior to your test.  On the Day of the Test: . Drink plenty of water. Do not drink any water within one hour of the test. . Do not eat any food 4 hours prior to the test. . You may take your regular medications prior to the test.  . Take metoprolol (Lopressor) 100 mg two hours prior to test.       After the Test: . Drink plenty of water. . After receiving IV contrast, you may experience a mild flushed feeling. This is normal. . On occasion, you may experience a mild rash up to 24 hours after the test. This is not dangerous. If this occurs, you can take Benadryl 25 mg and increase your fluid intake. . If you experience trouble breathing, this can be serious. If it is severe call 911 IMMEDIATELY. If it is mild, please call our office.   Once we have confirmed authorization from your insurance company, we will call you to set up a date and time for your test. Based on how quickly your insurance processes prior authorizations requests, please allow up to 4 weeks to be contacted for scheduling your Cardiac CT appointment. Be advised that routine Cardiac CT appointments could be scheduled as many as 8 weeks after your provider has ordered it.  For non-scheduling related questions, please contact the cardiac imaging nurse navigator should you have any questions/concerns: Marchia Bond, Cardiac Imaging Nurse Navigator Burley Saver, Interim Cardiac Imaging Nurse Navigator [facility] Direct Office Dial: [phone]   For scheduling needs, including cancellations and rescheduling, please call Vivien Rota at [phone], option 3.    3) 1 week heart monitor:  - Your physician has recommended that you wear a Zio XT monitor- placed in office today. This monitor is a medical device that records the  heart's electrical activity. Doctors most often use these monitors to diagnose arrhythmias. Arrhythmias are problems with the speed or rhythm of the heartbeat. The monitor is a small device applied to your chest. You can wear one while you do your normal daily activities. While wearing this monitor if you have any symptoms to push the button and record what you felt. Once you have worn this monitor for the period of time provider prescribed (Usually 14 days), you will return the monitor device in the postage paid box. Once it is returned they will download the data collected and provide [place] with a report which the provider will then review and we will call you with those results. Important tips:  1. Avoid showering during the first 24 hours of wearing the monitor. 2. Avoid excessive sweating to help maximize wear time. 3. Do not submerge the device, no hot tubs, and no swimming pools. 4. Keep any lotions or oils away from the patch. 5. After 24 hours you may shower with the patch on. Take brief showers with your back facing the shower head.  6. Do not remove patch once it has been placed because that will interrupt data and decrease adhesive wear time. 7. Push the button when you have any symptoms and write down what you were feeling. 8. Once you have completed wearing your monitor, remove and place into box which has postage paid and place in your outgoing mailbox.  9. If for some reason you have misplaced your box then call our office and we can provide another box and/or mail it off  for you.           Follow-Up: At [facility], you and your health needs are our priority.  As part of our continuing mission to provide you with exceptional heart care, we have created designated Provider Care Teams.  These Care Teams include your primary Cardiologist (physician) and Advanced Practice Providers (APPs -  Physician Assistants and Nurse Practitioners) who all work together to provide you with the  care you need, when you need it.  We recommend signing up for the patient portal called "MyChart".  Sign up information is provided on this After Visit Summary.  MyChart is used to connect with patients for Virtual Visits (Telemedicine).  Patients are able to view lab/test results, encounter notes, upcoming appointments, etc.  Non-urgent messages can be sent to your provider as well.   To learn more about what you can do with MyChart, go to [email or web address].    Your next appointment:   6 week(s)  The format for your next appointment:   In Person  Provider:   Kate Sable, MD   Other Instructions  Echocardiogram An echocardiogram is a procedure that uses painless sound waves (ultrasound) to produce an image of the heart. Images from an echocardiogram can provide important information about:  Signs of coronary artery disease (CAD).  Aneurysm detection. An aneurysm is a weak or damaged part of an artery wall that bulges out from the normal force of blood pumping through the body.  Heart size and shape. Changes in the size or shape of the heart can be associated with certain conditions, including heart failure, aneurysm, and CAD.  Heart muscle function.  Heart valve function.  Signs of a past heart attack.  Fluid buildup around the heart.  Thickening of the heart muscle.  A tumor or infectious growth around the heart valves. Tell a health care provider about:  Any allergies you have.  All medicines you are taking, including vitamins, herbs, eye drops, creams, and over-the-counter medicines.  Any blood disorders you have.  Any surgeries you have had.  Any medical conditions you have.  Whether you are pregnant or may be pregnant. What are the risks? Generally, this is a safe procedure. However, problems may occur, including:  Allergic reaction to dye (contrast) that may be used during the procedure. What happens before the procedure? No specific  preparation is needed. You may eat and drink normally. What happens during the procedure?   An IV tube may be inserted into one of your veins.  You may receive contrast through this tube. A contrast is an injection that improves the quality of the pictures from your heart.  A gel will be applied to your chest.  A wand-like tool (transducer) will be moved over your chest. The gel will help to transmit the sound waves from the transducer.  The sound waves will harmlessly bounce off of your heart to allow the heart images to be captured in real-time motion. The images will be recorded on a computer. The procedure may vary among health care providers and hospitals. What happens after the procedure?  You may return to your normal, everyday life, including diet, activities, and medicines, unless your health care provider tells you not to do that. Summary  An echocardiogram is a procedure that uses painless sound waves (ultrasound) to produce an image of the heart.  Images from an echocardiogram can provide important information about the size and shape of your heart, heart muscle function, heart valve function, and  fluid buildup around your heart.  You do not need to do anything to prepare before this procedure. You may eat and drink normally.  After the echocardiogram is completed, you may return to your normal, everyday life, unless your health care provider tells you not to do that. This information is not intended to replace advice given to you by your health care provider. Make sure you discuss any questions you have with your health care provider. Document Revised: 07/29/2018 Document Reviewed: 05/10/2016 Elsevier Patient Education  [place].   Cardiac CT Angiogram A cardiac CT angiogram is a procedure to Dalton at the heart and the area around the heart. It may be done to help find the cause of chest pains or other symptoms of heart disease. During this procedure, a substance  called contrast dye is injected into the blood vessels in the area to be checked. A large X-ray machine, called a CT scanner, then takes detailed pictures of the heart and the surrounding area. The procedure is also sometimes called a coronary CT angiogram, coronary artery scanning, or CTA. A cardiac CT angiogram allows the health care provider to see how well blood is flowing to and from the heart. The health care provider will be able to see if there are any problems, such as:  Blockage or narrowing of the coronary arteries in the heart.  Fluid around the heart.  Signs of weakness or disease in the muscles, valves, and tissues of the heart. Tell a health care provider about:  Any allergies you have. This is especially important if you have had a previous allergic reaction to contrast dye.  All medicines you are taking, including vitamins, herbs, eye drops, creams, and over-the-counter medicines.  Any blood disorders you have.  Any surgeries you have had.  Any medical conditions you have.  Whether you are pregnant or may be pregnant.  Any anxiety disorders, chronic pain, or other conditions you have that may increase your stress or prevent you from lying still. What are the risks? Generally, this is a safe procedure. However, problems may occur, including:  Bleeding.  Infection.  Allergic reactions to medicines or dyes.  Damage to other structures or organs.  Kidney damage from the contrast dye that is used.  Increased risk of cancer from radiation exposure. This risk is low. Talk with your health care provider about: ? The risks and benefits of testing. ? How you can receive the lowest dose of radiation. What happens before the procedure?  Wear comfortable clothing and remove any jewelry, glasses, dentures, and hearing aids.  Follow instructions from your health care provider about eating and drinking. This may include: ? For 12 hours before the procedure -- avoid  caffeine. This includes tea, coffee, soda, energy drinks, and diet pills. Drink plenty of water or other fluids that do not have caffeine in them. Being well hydrated can prevent complications. ? For 4-6 hours before the procedure -- stop eating and drinking. The contrast dye can cause nausea, but this is less likely if your stomach is empty.  Ask your health care provider about changing or stopping your regular medicines. This is especially important if you are taking diabetes medicines, blood thinners, or medicines to treat problems with erections (erectile dysfunction). What happens during the procedure?   Hair on your chest may need to be removed so that small sticky patches called electrodes can be placed on your chest. These will transmit information that helps to monitor your heart during  the procedure.  An IV will be inserted into one of your veins.  You might be given a medicine to control your heart rate during the procedure. This will help to ensure that good images are obtained.  You will be asked to lie on an exam table. This table will slide in and out of the CT machine during the procedure.  Contrast dye will be injected into the IV. You might feel warm, or you may get a metallic taste in your mouth.  You will be given a medicine called nitroglycerin. This will relax or dilate the arteries in your heart.  The table that you are lying on will move into the CT machine tunnel for the scan.  The person running the machine will give you instructions while the scans are being done. You may be asked to: ? Keep your arms above your head. ? Hold your breath. ? Stay very still, even if the table is moving.  When the scanning is complete, you will be moved out of the machine.  The IV will be removed. The procedure may vary among health care providers and hospitals. What can I expect after the procedure? After your procedure, it is common to have:  A metallic taste in your mouth  from the contrast dye.  A feeling of warmth.  A headache from the nitroglycerin. Follow these instructions at home:  Take over-the-counter and prescription medicines only as told by your health care provider.  If you are told, drink enough fluid to keep your urine pale yellow. This will help to flush the contrast dye out of your body.  Most people can return to their normal activities right after the procedure. Ask your health care provider what activities are safe for you.  It is up to you to get the results of your procedure. Ask your health care provider, or the department that is doing the procedure, when your results will be ready.  Keep all follow-up visits as told by your health care provider. This is important. Contact a health care provider if:  You have any symptoms of allergy to the contrast dye. These include: ? Shortness of breath. ? Rash or hives. ? A racing heartbeat. Summary  A cardiac CT angiogram is a procedure to Dalton at the heart and the area around the heart. It may be done to help find the cause of chest pains or other symptoms of heart disease.  During this procedure, a large X-ray machine, called a CT scanner, takes detailed pictures of the heart and the surrounding area after a contrast dye has been injected into blood vessels in the area.  Ask your health care provider about changing or stopping your regular medicines before the procedure. This is especially important if you are taking diabetes medicines, blood thinners, or medicines to treat erectile dysfunction.  If you are told, drink enough fluid to keep your urine pale yellow. This will help to flush the contrast dye out of your body. This information is not intended to replace advice given to you by your health care provider. Make sure you discuss any questions you have with your health care provider. Document Revised: 12/01/2018 Document Reviewed: 12/01/2018 Elsevier Patient Education  2020 [place], Kate Sable, MD  02/03/2020 12:37 PM    [facility]

## 2020-02-03 NOTE — Patient Instructions (Signed)
Medication Instructions:  - Your physician recommends that you continue on your current medications as directed. Please refer to the Current Medication list given to you today.  *If you need a refill on your cardiac medications before your next appointment, please call your pharmacy*   Lab Work: - none ordered  If you have labs (blood work) drawn today and your tests are completely normal, you will receive your results only by: Marland Kitchen MyChart Message (if you have MyChart) OR . A paper copy in the mail If you have any lab test that is abnormal or we need to change your treatment, we will call you to review the results.   Testing/Procedures:  1) Echocardiogram:  - Your physician has requested that you have an echocardiogram (after 02/10/20). Echocardiography is a painless test that uses sound waves to create images of your heart. It provides your doctor with information about the size and shape of your heart and how well your heart's chambers and valves are working. This procedure takes approximately one hour. There are no restrictions for this procedure. There is a possibility that an IV may need to be started during your test to inject an image enhancing agent. This is done to obtain more optimal pictures of your heart. Therefore we ask that you do at least drink some water prior to coming in to hydrate your veins.   2) Cardiac CT  - Your physician has requested that you have cardiac CT. Cardiac computed tomography (CT) is a painless test that uses an x-ray machine to take clear, detailed pictures of your heart.   Your cardiac CT will be scheduled at one of the below locations:   [facility] [address] [place], [place] [address] [phone]  [facility] [address] [place], [place] [address] [phone]  If scheduled at [facility], please arrive at the [facility] main entrance of [facility] 30  minutes prior to test start time. Proceed to the [facility] Radiology Department (first floor) to check-in and test prep.  If scheduled at [facility], please arrive 15 mins early for check-in and test prep.  Please follow these instructions carefully (unless otherwise directed):  Hold all erectile dysfunction medications at least 3 days (72 hrs) prior to test.  On the Night Before the Test: . Be sure to Drink plenty of water. . Do not consume any caffeinated/decaffeinated beverages or chocolate 12 hours prior to your test. . Do not take any antihistamines 12 hours prior to your test.  On the Day of the Test: . Drink plenty of water. Do not drink any water within one hour of the test. . Do not eat any food 4 hours prior to the test. . You may take your regular medications prior to the test.  . Take metoprolol (Lopressor) 100 mg two hours prior to test.       After the Test: . Drink plenty of water. . After receiving IV contrast, you may experience a mild flushed feeling. This is normal. . On occasion, you may experience a mild rash up to 24 hours after the test. This is not dangerous. If this occurs, you can take Benadryl 25 mg and increase your fluid intake. . If you experience trouble breathing, this can be serious. If it is severe call 911 IMMEDIATELY. If it is mild, please call our office.   Once we have confirmed authorization from [employer],  we will call you to set up a date and time for your test. Based on how quickly your insurance processes prior authorizations requests, please allow up to 4 weeks to be contacted for scheduling your Cardiac CT appointment. Be advised that routine Cardiac CT appointments could be scheduled as many as 8 weeks after your provider has ordered it.  For non-scheduling related questions, please contact the cardiac imaging nurse navigator should you have any questions/concerns: Marchia Bond, Cardiac Imaging Nurse  Navigator Burley Saver, Interim Cardiac Imaging Nurse Navigator [facility] Direct Office Dial: [phone]   For scheduling needs, including cancellations and rescheduling, please call Vivien Rota at [phone], option 3.    3) 1 week heart monitor:  - Your physician has recommended that you wear a Zio XT monitor- placed in office today. This monitor is a medical device that records the heart's electrical activity. Doctors most often use these monitors to diagnose arrhythmias. Arrhythmias are problems with the speed or rhythm of the heartbeat. The monitor is a small device applied to your chest. You can wear one while you do your normal daily activities. While wearing this monitor if you have any symptoms to push the button and record what you felt. Once you have worn this monitor for the period of time provider prescribed (Usually 14 days), you will return the monitor device in the postage paid box. Once it is returned they will download the data collected and provide [place] with a report which the provider will then review and we will call you with those results. Important tips:  1. Avoid showering during the first 24 hours of wearing the monitor. 2. Avoid excessive sweating to help maximize wear time. 3. Do not submerge the device, no hot tubs, and no swimming pools. 4. Keep any lotions or oils away from the patch. 5. After 24 hours you may shower with the patch on. Take brief showers with your back facing the shower head.  6. Do not remove patch once it has been placed because that will interrupt data and decrease adhesive wear time. 7. Push the button when you have any symptoms and write down what you were feeling. 8. Once you have completed wearing your monitor, remove and place into box which has postage paid and place in your outgoing mailbox.  9. If for some reason you have misplaced your box then call our office and we can provide another box and/or mail it off for  you.           Follow-Up: At [facility], you and your health needs are our priority.  As part of our continuing mission to provide you with exceptional heart care, we have created designated Provider Care Teams.  These Care Teams include your primary Cardiologist (physician) and Advanced Practice Providers (APPs -  Physician Assistants and Nurse Practitioners) who all work together to provide you with the care you need, when you need it.  We recommend signing up for the patient portal called "MyChart".  Sign up information is provided on this After Visit Summary.  MyChart is used to connect with patients for Virtual Visits (Telemedicine).  Patients are able to view lab/test results, encounter notes, upcoming appointments, etc.  Non-urgent messages can be sent to your provider as well.   To learn more about what you can do with MyChart, go to [email or web address].    Your next appointment:   6 week(s)  The format for your next appointment:   In Person  Provider:   Kate Sable, MD   Other Instructions  Echocardiogram An echocardiogram is a procedure that uses painless sound waves (ultrasound) to produce an image of the heart. Images from an echocardiogram can provide important information about:  Signs of coronary artery disease (CAD).  Aneurysm detection. An aneurysm is a weak or damaged part of an artery wall that bulges out from the normal force of blood pumping through the body.  Heart size and shape. Changes in the size or shape of the heart can be associated with certain conditions, including heart failure, aneurysm, and CAD.  Heart muscle function.  Heart valve function.  Signs of a past heart attack.  Fluid buildup around the heart.  Thickening of the heart muscle.  A tumor or infectious growth around the heart valves. Tell a health care provider about:  Any allergies you have.  All medicines you are taking, including vitamins, herbs, eye drops,  creams, and over-the-counter medicines.  Any blood disorders you have.  Any surgeries you have had.  Any medical conditions you have.  Whether you are pregnant or may be pregnant. What are the risks? Generally, this is a safe procedure. However, problems may occur, including:  Allergic reaction to dye (contrast) that may be used during the procedure. What happens before the procedure? No specific preparation is needed. You may eat and drink normally. What happens during the procedure?   An IV tube may be inserted into one of your veins.  You may receive contrast through this tube. A contrast is an injection that improves the quality of the pictures from your heart.  A gel will be applied to your chest.  A wand-like tool (transducer) will be moved over your chest. The gel will help to transmit the sound waves from the transducer.  The sound waves will harmlessly bounce off of your heart to allow the heart images to be captured in real-time motion. The images will be recorded on a computer. The procedure may vary among health care providers and hospitals. What happens after the procedure?  You may return to your normal, everyday life, including diet, activities, and medicines, unless your health care provider tells you not to do that. Summary  An echocardiogram is a procedure that uses painless sound waves (ultrasound) to produce an image of the heart.  Images from an echocardiogram can provide important information about the size and shape of your heart, heart muscle function, heart valve function, and fluid buildup around your heart.  You do not need to do anything to prepare before this procedure. You may eat and drink normally.  After the echocardiogram is completed, you may return to your normal, everyday life, unless your health care provider tells you not to do that. This information is not intended to replace advice given to you by your health care provider. Make sure  you discuss any questions you have with your health care provider. Document Revised: 07/29/2018 Document Reviewed: 05/10/2016 Elsevier Patient Education  [place].   Cardiac CT Angiogram A cardiac CT angiogram is a procedure to look at the heart and the area around the heart. It may be done to help find the cause of chest pains or other symptoms of heart disease. During this procedure, a substance called contrast dye is injected into the blood vessels in the area to be checked. A large X-ray machine, called a CT scanner, then takes detailed pictures of the heart and the surrounding area. The procedure is also sometimes called  a coronary CT angiogram, coronary artery scanning, or CTA. A cardiac CT angiogram allows the health care provider to see how well blood is flowing to and from the heart. The health care provider will be able to see if there are any problems, such as:  Blockage or narrowing of the coronary arteries in the heart.  Fluid around the heart.  Signs of weakness or disease in the muscles, valves, and tissues of the heart. Tell a health care provider about:  Any allergies you have. This is especially important if you have had a previous allergic reaction to contrast dye.  All medicines you are taking, including vitamins, herbs, eye drops, creams, and over-the-counter medicines.  Any blood disorders you have.  Any surgeries you have had.  Any medical conditions you have.  Whether you are pregnant or may be pregnant.  Any anxiety disorders, chronic pain, or other conditions you have that may increase your stress or prevent you from lying still. What are the risks? Generally, this is a safe procedure. However, problems may occur, including:  Bleeding.  Infection.  Allergic reactions to medicines or dyes.  Damage to other structures or organs.  Kidney damage from the contrast dye that is used.  Increased risk of cancer from radiation exposure. This risk is  low. Talk with your health care provider about: ? The risks and benefits of testing. ? How you can receive the lowest dose of radiation. What happens before the procedure?  Wear comfortable clothing and remove any jewelry, glasses, dentures, and hearing aids.  Follow instructions from your health care provider about eating and drinking. This may include: ? For 12 hours before the procedure -- avoid caffeine. This includes tea, coffee, soda, energy drinks, and diet pills. Drink plenty of water or other fluids that do not have caffeine in them. Being well hydrated can prevent complications. ? For 4-6 hours before the procedure -- stop eating and drinking. The contrast dye can cause nausea, but this is less likely if your stomach is empty.  Ask your health care provider about changing or stopping your regular medicines. This is especially important if you are taking diabetes medicines, blood thinners, or medicines to treat problems with erections (erectile dysfunction). What happens during the procedure?   Hair on your chest may need to be removed so that small sticky patches called electrodes can be placed on your chest. These will transmit information that helps to monitor your heart during the procedure.  An IV will be inserted into one of your veins.  You might be given a medicine to control your heart rate during the procedure. This will help to ensure that good images are obtained.  You will be asked to lie on an exam table. This table will slide in and out of the CT machine during the procedure.  Contrast dye will be injected into the IV. You might feel warm, or you may get a metallic taste in your mouth.  You will be given a medicine called nitroglycerin. This will relax or dilate the arteries in your heart.  The table that you are lying on will move into the CT machine tunnel for the scan.  The person running the machine will give you instructions while the scans are being done.  You may be asked to: ? Keep your arms above your head. ? Hold your breath. ? Stay very still, even if the table is moving.  When the scanning is complete, you will be moved out of  the machine.  The IV will be removed. The procedure may vary among health care providers and hospitals. What can I expect after the procedure? After your procedure, it is common to have:  A metallic taste in your mouth from the contrast dye.  A feeling of warmth.  A headache from the nitroglycerin. Follow these instructions at home:  Take over-the-counter and prescription medicines only as told by your health care provider.  If you are told, drink enough fluid to keep your urine pale yellow. This will help to flush the contrast dye out of your body.  Most people can return to their normal activities right after the procedure. Ask your health care provider what activities are safe for you.  It is up to you to get the results of your procedure. Ask your health care provider, or the department that is doing the procedure, when your results will be ready.  Keep all follow-up visits as told by your health care provider. This is important. Contact a health care provider if:  You have any symptoms of allergy to the contrast dye. These include: ? Shortness of breath. ? Rash or hives. ? A racing heartbeat. Summary  A cardiac CT angiogram is a procedure to look at the heart and the area around the heart. It may be done to help find the cause of chest pains or other symptoms of heart disease.  During this procedure, a large X-ray machine, called a CT scanner, takes detailed pictures of the heart and the surrounding area after a contrast dye has been injected into blood vessels in the area.  Ask your health care provider about changing or stopping your regular medicines before the procedure. This is especially important if you are taking diabetes medicines, blood thinners, or medicines to treat erectile  dysfunction.  If you are told, drink enough fluid to keep your urine pale yellow. This will help to flush the contrast dye out of your body. This information is not intended to replace advice given to you by your health care provider. Make sure you discuss any questions you have with your health care provider. Document Revised: 12/01/2018 Document Reviewed: 12/01/2018 Elsevier Patient Education  [place].

## 2020-02-20 ENCOUNTER — Ambulatory Visit: Payer: 59

## 2020-02-23 ENCOUNTER — Ambulatory Visit (INDEPENDENT_AMBULATORY_CARE_PROVIDER_SITE_OTHER): Payer: 59

## 2020-02-23 ENCOUNTER — Other Ambulatory Visit: Payer: Self-pay

## 2020-02-23 DIAGNOSIS — R079 Chest pain, unspecified: Secondary | ICD-10-CM | POA: Diagnosis not present

## 2020-02-23 DIAGNOSIS — R0600 Dyspnea, unspecified: Secondary | ICD-10-CM

## 2020-02-23 LAB — ECHOCARDIOGRAM COMPLETE
Area-P 1/2: 2.6 cm2
S' Lateral: 2.9 cm

## 2020-02-28 ENCOUNTER — Telehealth: Payer: Self-pay

## 2020-02-28 NOTE — Telephone Encounter (Signed)
LMOM for patient to call back for Zio results.

## 2020-03-01 NOTE — Telephone Encounter (Signed)
Left a VM X2 for patient, for Zio and Echo results. Also released result notes through MyChart.

## 2020-03-19 ENCOUNTER — Encounter: Payer: Self-pay | Admitting: Cardiology

## 2020-03-19 ENCOUNTER — Other Ambulatory Visit: Payer: Self-pay

## 2020-03-19 ENCOUNTER — Ambulatory Visit: Payer: 59 | Admitting: Cardiology

## 2020-03-19 VITALS — BP 104/78 | HR 71 | Ht 74.0 in | Wt 260.0 lb

## 2020-03-19 DIAGNOSIS — E7800 Pure hypercholesterolemia, unspecified: Secondary | ICD-10-CM

## 2020-03-19 DIAGNOSIS — R000 Tachycardia, unspecified: Secondary | ICD-10-CM

## 2020-03-19 DIAGNOSIS — R079 Chest pain, unspecified: Secondary | ICD-10-CM

## 2020-03-19 DIAGNOSIS — F17200 Nicotine dependence, unspecified, uncomplicated: Secondary | ICD-10-CM | POA: Diagnosis not present

## 2020-03-19 MED ORDER — METOPROLOL SUCCINATE ER 25 MG PO TB24: 25.0000 mg | ORAL_TABLET | Freq: Every day | ORAL | 5 refills | Status: DC

## 2020-03-19 MED ORDER — METOPROLOL TARTRATE 100 MG PO TABS: ORAL_TABLET | ORAL | 0 refills | Status: DC

## 2020-03-19 MED ORDER — IVABRADINE HCL 5 MG PO TABS: ORAL_TABLET | ORAL | 0 refills | Status: DC

## 2020-03-19 NOTE — Patient Instructions (Signed)
Medication Instructions:   Your physician has recommended you make the following change in your medication:   1.  START AFTER YOUR CTA taking Metoprolol Succinate (Toprol XL) 25 MG: one tab by mouth daily.  *If you need a refill on your cardiac medications before your next appointment, please call your pharmacy*   Lab Work: None Ordered  If you have labs (blood work) drawn today and your tests are completely normal, you will receive your results only by: Marland Kitchen MyChart Message (if you have MyChart) OR . A paper copy in the mail If you have any lab test that is abnormal or we need to change your treatment, we will call you to review the results.   Testing/Procedures:  Please call Craig Guess at [phone] to schedule your CTA.  [facility] [address] [place], [place] [address] [phone]  If scheduled at [facility], please arrive at the [facility] main entrance of [facility] 30 minutes prior to test start time. Proceed to the [facility] Radiology Department (first floor) to check-in and test prep.  If scheduled at [facility], please arrive 15 mins early for check-in and test prep.  Please follow these instructions carefully (unless otherwise directed):    Hold all erectile dysfunction medications at least 3 days (72 hrs) prior to test.   On the Night Before the Test:  Be sure to Drink plenty of water.  Do not consume any caffeinated/decaffeinated beverages or chocolate 12 hours prior to your test.  Do not take any antihistamines 12 hours prior to your test.  On the Day of the Test:  Drink plenty of water. Do not drink any water within one hour of the test.  Do not eat any food 4 hours prior to the test.  You may take your regular medications prior to the test.   Take metoprolol (Lopressor) 100 mg two hours prior to test.  Take Ivabradine (Corlanor) 15 mg two  hours prior to test.       After the Test:  Drink plenty of water.  After receiving IV contrast, you may experience a mild flushed feeling. This is normal.  On occasion, you may experience a mild rash up to 24 hours after the test. This is not dangerous. If this occurs, you can take Benadryl 25 mg and increase your fluid intake.  If you experience trouble breathing, this can be serious. If it is severe call 911 IMMEDIATELY. If it is mild, please call our office.   Once we have confirmed authorization from your insurance company, we will call you to set up a date and time for your test. Based on how quickly your insurance processes prior authorizations requests, please allow up to 4 weeks to be contacted for scheduling your Cardiac CT appointment. Be advised that routine Cardiac CT appointments could be scheduled as many as 8 weeks after your provider has ordered it.  For non-scheduling related questions, please contact the cardiac imaging nurse navigator should you have any questions/concerns: Marchia Bond, Cardiac Imaging Nurse Navigator Burley Saver, Interim Cardiac Imaging Nurse Navigator [facility] Direct Office Dial: [phone]      Follow-Up: At [facility], you and your health needs are our priority.  As part of our continuing mission to provide you with exceptional heart care, we have created designated Provider Care Teams.  These Care Teams include your primary Cardiologist (physician) and Advanced Practice Providers (APPs -  Physician Assistants and Nurse Practitioners) who all work together to provide you with the care you need, when you need it.  We recommend signing up for the patient portal called "MyChart".  Sign up information is provided on this After Visit Summary.  MyChart is used to connect with patients for Virtual Visits (Telemedicine).  Patients are able to view lab/test results, encounter notes, upcoming appointments, etc.   Non-urgent messages can be sent to your provider as well.   To learn more about what you can do with MyChart, go to [email or web address].    Your next appointment:   Follow up after CTA , Please call our office after your CTA is scheduled.  The format for your next appointment:   In Person  Provider:   You may see Kate Sable, MD or one of the following Advanced Practice Providers on your designated Care Team:    Murray Hodgkins, NP  Christell Faith, PA-C  Marrianne Mood, PA-C  Cadence Orlando, [place]  Laurann Montana, NP    Other Instructions

## 2020-03-19 NOTE — Progress Notes (Signed)
Cardiology Office Note:    Date:  03/19/2020   ID:  Taylor Dalton, DOB 1975/06/01, MRN [id number]  PCP:  Marval Regal, NP  [facility] Cardiologist:  Kate Sable, MD  [place] Electrophysiologist:  None   Referring MD: Marval Regal, NP   Chief Complaint  Patient presents with  . Follow-up    Discuss Echo & Zio results, has not had the CT yet. Meds reviewed by the pt. verbally. Pt. c/o chest pain and shortness of breath mostly with over exertion.     History of Present Illness:    Taylor Dalton is a 44 y.o. male with a hx of hyperlipidemia, COVID-19 infection 12/2019, current smoker x30 years who presents for follow-up.  Last seen due to chest pain, shortness of breath and elevated heart rates.  Heart rate elevation as high as 180s with routine ambulation.  Echo, coronary CTA and cardiac monitor was ordered to evaluate of both symptoms.  Still with shortness of breath with overexertion.  Has also noted oxygen desaturations with ambulation.  He still has elevated heart rates and shortness of breath with exertion.  He still smokes.   Past Medical History:  Diagnosis Date  . Chicken pox   . Headache     Past Surgical History:  Procedure Laterality Date  . KNEE SURGERY Right     Current Medications: Current Meds  Medication Sig  . albuterol (VENTOLIN HFA) 108 (90 Base) MCG/ACT inhaler Inhale 2 puffs into the lungs every 6 (six) hours as needed for wheezing or shortness of breath.  . cyclobenzaprine (FLEXERIL) 10 MG tablet Take 1 tablet (10 mg total) by mouth at bedtime as needed for muscle spasms.     Allergies:   Patient has no known allergies.   Social History   Socioeconomic History  . Marital status: Married    Spouse name: Not on file  . Number of children: Not on file  . Years of education: Not on file  . Highest education level: Not on file  Occupational History  . Occupation: Banker [employer]  Tobacco Use  . Smoking  status: Current Some Day Smoker    Packs/day: 0.50    Types: Cigarettes  . Smokeless tobacco: Never Used  . Tobacco comment: On and Off for 30 years.   Vaping Use  . Vaping Use: Every day  Substance and Sexual Activity  . Alcohol use: Yes    Alcohol/week: 2.0 - 3.0 standard drinks    Types: 2 - 3 Standard drinks or equivalent per week  . Drug use: No  . Sexual activity: Yes  Other Topics Concern  . Not on file  Social History Narrative   Married lives locally, No pets, 6 children and 4 adults still living with them.    Social Determinants of Health   Financial Resource Strain:   . Difficulty of Paying Living Expenses: Not on file  Food Insecurity:   . Worried About Charity fundraiser in the Last Year: Not on file  . Ran Out of Food in the Last Year: Not on file  Transportation Needs:   . Lack of Transportation (Medical): Not on file  . Lack of Transportation (Non-Medical): Not on file  Physical Activity:   . Days of Exercise per Week: Not on file  . Minutes of Exercise per Session: Not on file  Stress:   . Feeling of Stress : Not on file  Social Connections:   . Frequency of Communication with Friends  and Family: Not on file  . Frequency of Social Gatherings with Friends and Family: Not on file  . Attends Religious Services: Not on file  . Active Member of Clubs or Organizations: Not on file  . Attends Archivist Meetings: Not on file  . Marital Status: Not on file     Family History: The patient's family history includes Asthma in his brother; Cancer in his brother and maternal grandmother; Early death in his brother; Heart disease in his father; Prostate cancer in his paternal grandmother.  ROS:   Please see the history of present illness.     All other systems reviewed and are negative.  EKGs/Labs/Other Studies Reviewed:    The following studies were reviewed today:   EKG:  EKG is  ordered today.  The ekg ordered today demonstrates normal sinus  rhythm, normal ECG.  Recent Labs: 11/29/2019: TSH 1.86 01/19/2020: ALT 45; BUN 17; Creatinine, Ser 0.79; Hemoglobin 15.5; Magnesium 2.2; Platelets 244; Potassium 4.1; Sodium 139  Recent Lipid Panel    Component Value Date/Time   CHOL 231 (H) 11/29/2019 1116   TRIG 216.0 (H) 11/29/2019 1116   HDL 43.50 11/29/2019 1116   CHOLHDL 5 11/29/2019 1116   VLDL 43.2 (H) 11/29/2019 1116   LDLCALC 129 (H) 12/05/2016 0400   LDLDIRECT 152.0 11/29/2019 1116     Risk Assessment/Calculations:      Physical Exam:    VS:  BP 104/78 (BP Location: Left Arm, Patient Position: Sitting, Cuff Size: Normal)   Pulse 71   Ht 6' 2"  (1.88 m)   Wt 260 lb (117.9 kg)   SpO2 98%   BMI 33.38 kg/m     Wt Readings from Last 3 Encounters:  03/19/20 260 lb (117.9 kg)  02/03/20 263 lb (119.3 kg)  01/20/20 261 lb 0.1 oz (118.4 kg)     GEN:  Well nourished, well developed in no acute distress HEENT: Normal NECK: No JVD; No carotid bruits LYMPHATICS: No lymphadenopathy CARDIAC: RRR, no murmurs, rubs, gallops RESPIRATORY:  Clear to auscultation without rales, wheezing or rhonchi  ABDOMEN: Soft, non-tender, non-distended MUSCULOSKELETAL:  No edema; No deformity  SKIN: Warm and dry NEUROLOGIC:  Alert and oriented x 3 PSYCHIATRIC:  Normal affect   ASSESSMENT:    1. Chest pain of uncertain etiology   2. Pure hypercholesterolemia   3. Smoking   4. Tachycardia    PLAN:    In order of problems listed above:  1. chest pain.  Has risk factors of hyperlipidemia, current smoker.  Echo 02/23/2020 showed normal systolic and diastolic function, normal echocardiogram.  Obtain coronary CTA as ordered to evaluate presence of CAD. 2. History of hyperlipidemia, low-cholesterol diet advised.  Patient not in statin benefit group. 3. Patient is a current smoker, he has shortness of breath.  Smoking cessation recommended.  Unsure if shortness of breath is secondary to smoking, regardless smoking cessation advised.   Evaluate with coronary CTA as above. 4. Patient with inappropriate tachycardia, with ambulation.  Cardiac monitor with no significant arrhythmias.  Patient triggered events were associated with sinus rhythm or sinus tachycardia, heart rate up to 162. start Toprol-XL 25 mg daily.  Follow-up after cardiac CT.    Medication Adjustments/Labs and Tests Ordered: Current medicines are reviewed at length with the patient today.  Concerns regarding medicines are outlined above.  Orders Placed This Encounter  Procedures  . EKG 12-Lead   Meds ordered this encounter  Medications  . metoprolol tartrate (LOPRESSOR) 100 MG tablet  Sig: Take 1 tablet (100 mg) by mouth 2 hour prior to your cardiac cta x 1 dose    Dispense:  1 tablet    Refill:  0  . metoprolol succinate (TOPROL XL) 25 MG 24 hr tablet    Sig: Take 1 tablet (25 mg total) by mouth daily. Start after your CTA scan.    Dispense:  30 tablet    Refill:  5  . ivabradine (CORLANOR) 5 MG TABS tablet    Sig: Take 3 tablets (15 mg) by mouth 2 hour prior to your cardiac cta.    Dispense:  3 tablet    Refill:  0    Patient Instructions  Medication Instructions:   Your physician has recommended you make the following change in your medication:   1.  START AFTER YOUR CTA taking Metoprolol Succinate (Toprol XL) 25 MG: one tab by mouth daily.  *If you need a refill on your cardiac medications before your next appointment, please call your pharmacy*   Lab Work: None Ordered  If you have labs (blood work) drawn today and your tests are completely normal, you will receive your results only by: Marland Kitchen MyChart Message (if you have MyChart) OR . A paper copy in the mail If you have any lab test that is abnormal or we need to change your treatment, we will call you to review the results.   Testing/Procedures:  Please call Craig Guess at [phone] to schedule your CTA.  [facility] [address] [place], [place] [address] [phone]  If scheduled at [facility], please arrive at the [facility] main entrance of [facility] 30 minutes prior to test start time. Proceed to the [facility] Radiology Department (first floor) to check-in and test prep.  If scheduled at [facility], please arrive 15 mins early for check-in and test prep.  Please follow these instructions carefully (unless otherwise directed):    Hold all erectile dysfunction medications at least 3 days (72 hrs) prior to test.   On the Night Before the Test:  Be sure to Drink plenty of water.  Do not consume any caffeinated/decaffeinated beverages or chocolate 12 hours prior to your test.  Do not take any antihistamines 12 hours prior to your test.  On the Day of the Test:  Drink plenty of water. Do not drink any water within one hour of the test.  Do not eat any food 4 hours prior to the test.  You may take your regular medications prior to the test.   Take metoprolol (Lopressor) 100 mg two hours prior to test.  Take Ivabradine (Corlanor) 15 mg two hours prior to test.       After the Test:  Drink plenty of water.  After receiving IV contrast, you may experience a mild flushed feeling. This is normal.  On occasion, you may experience a mild rash up to 24 hours after the test. This is not dangerous. If this occurs, you can take Benadryl 25 mg and increase your fluid intake.  If you experience trouble breathing, this can be serious. If it is severe call 911 IMMEDIATELY. If it is mild, please call our office.   Once we have confirmed authorization from your insurance company, we will call you to set up a date and time for your test. Based on how quickly your insurance processes prior authorizations requests, please allow up to 4 weeks to be contacted for scheduling  your Cardiac CT appointment. Be advised that routine Cardiac CT appointments  could be scheduled as many as 8 weeks after your provider has ordered it.  For non-scheduling related questions, please contact the cardiac imaging nurse navigator should you have any questions/concerns: Marchia Bond, Cardiac Imaging Nurse Navigator Burley Saver, Interim Cardiac Imaging Nurse Navigator [facility] Direct Office Dial: [phone]      Follow-Up: At [facility], you and your health needs are our priority.  As part of our continuing mission to provide you with exceptional heart care, we have created designated Provider Care Teams.  These Care Teams include your primary Cardiologist (physician) and Advanced Practice Providers (APPs -  Physician Assistants and Nurse Practitioners) who all work together to provide you with the care you need, when you need it.  We recommend signing up for the patient portal called "MyChart".  Sign up information is provided on this After Visit Summary.  MyChart is used to connect with patients for Virtual Visits (Telemedicine).  Patients are able to view lab/test results, encounter notes, upcoming appointments, etc.  Non-urgent messages can be sent to your provider as well.   To learn more about what you can do with MyChart, go to [email or web address].    Your next appointment:   Follow up after CTA , Please call our office after your CTA is scheduled.  The format for your next appointment:   In Person  Provider:   You may see Kate Sable, MD or one of the following Advanced Practice Providers on your designated Care Team:    Murray Hodgkins, NP  Christell Faith, PA-C  Marrianne Mood, PA-C  Cadence [place], [place]  Laurann Montana, NP    Other Instructions      Signed, Kate Sable, MD  03/19/2020 12:30 PM    [place]

## 2020-03-29 ENCOUNTER — Other Ambulatory Visit: Payer: 59

## 2020-04-03 ENCOUNTER — Telehealth (HOSPITAL_COMMUNITY): Payer: Self-pay | Admitting: *Deleted

## 2020-04-03 NOTE — Telephone Encounter (Signed)
Attempted to call patient regarding upcoming cardiac CT appointment. Left message on voicemail with name and callback number  Eldean Nanna Tai RN Navigator Cardiac Imaging [facility] [phone] Office [phone] Cell  

## 2020-04-05 ENCOUNTER — Ambulatory Visit
Admission: RE | Admit: 2020-04-05 | Discharge: 2020-04-05 | Disposition: A | Discharge status: Home / Self Care | Payer: 59 | Source: Ambulatory Visit | Attending: Cardiology | Admitting: Cardiology

## 2020-04-05 ENCOUNTER — Other Ambulatory Visit: Payer: Self-pay

## 2020-04-05 DIAGNOSIS — R072 Precordial pain: Secondary | ICD-10-CM

## 2020-04-05 MED ORDER — IOHEXOL 350 MG/ML SOLN
100.0000 mL | Freq: Once | INTRAVENOUS | Status: AC | PRN
  Administered 2020-04-05: 10:00:00 85 mL via INTRAVENOUS

## 2020-04-05 MED ORDER — NITROGLYCERIN 0.4 MG SL SUBL
0.8000 mg | SUBLINGUAL_TABLET | Freq: Once | SUBLINGUAL | Status: AC
  Administered 2020-04-05: 10:00:00 0.8 mg via SUBLINGUAL

## 2020-04-05 NOTE — Progress Notes (Signed)
Patient tolerated CT well. Drank coffee and given water to drink after CT.Marland Kitchen Vital signs stable encourage to drink water throughout day.Reasons explained and verbalized understanding. Ambulated steady gait.

## 2020-04-27 ENCOUNTER — Other Ambulatory Visit: Payer: 59

## 2021-07-07 NOTE — Progress Notes (Signed)
?Subjective:  ? ? Taylor Dalton - 46 y.o. male MRN [id number]  Date of birth: [birth year] ? ?HPI ? ?[name] is to establish care.  ? ?Current issues and/or concerns: ?HOSPITAL DISCHARGE FOLLOW-UP: ?[facility] Emergency Department per MD note: ?Differential diagnosis includes, but is not limited to, subdural, SAH, skull fracture, C-spine fracture, T-spine fracture, right knee fracture, right ankle fracture, multiple contusions, concussion ?  ?EKG shows normal sinus rhythm, see physician read ?  ?CT of the head was independently reviewed by me, I do not see any acute abnormality.  Read as no intracranial abnormality by radiologist. ?  ?X-rays of the C-spine, T-spine, right knee and right ankle were ordered. ?  ?X-ray of the C-spine, T-spine, right knee were independently reviewed by me and do not show fractures.  Confirmed by radiology. ?X-ray of the right ankle independently reviewed by me and I do agree with the radiologist on history that could be a fracture posteriorly as the patient is quite tender at the distal tibia. ?  ?Patient was placed in a cam boot.  He is to elevate and ice.  Follow-up with orthopedics.  He also has a concussion and so Worker's Comp. restrictions did include no climbing no crawling, no walking, restrict noise in his environment, and limit bluelight screen time to 2 to 4 hours/day for 1 week.  For walking and putting weight on the right ankle he should avoid putting any weight on the ankle until evaluated by orthopedics.  I did offer the patient pain medication.  He states he would only take Tylenol or ibuprofen if needed.  He was discharged stable condition. ? ?Follow-Ups: Schedule an appointment with Leim Fabry, MD (Orthopedic Surgery); for recheck of ankle ?  ?07/12/2021: ?Waiting for worker's comp to tell him where he should be seen therefore holding referral to Orthopedics at this time. Reports feeling tired no matter how much sleep he gets. He is staying  hydrated. Fluctuations in weight. Can lose 30 pounds quickly on diet and then once diet ends gains weight back. Thinks may be related to low testosterone.  ?  ? ?ROS per HPI  ? ? ? ?Health Maintenance:  ?Health Maintenance Due  ?Topic Date Due  ? Hepatitis C Screening  Never done  ? TETANUS/TDAP  Never done  ? COVID-19 Vaccine (3 - Booster for Pfizer series) 10/11/2019  ? COLONOSCOPY (Pts 45-31yrs Insurance coverage will need to be confirmed)  Never done  ? INFLUENZA VACCINE  Never done  ? ? ? ?Past Medical History: ?Patient Active Problem List  ? Diagnosis Date Noted  ? History of COVID-19 01/20/2020  ? Tachycardia 01/20/2020  ? Acute non-recurrent sinusitis 01/16/2020  ? COVID-19 virus infection 01/11/2020  ? Nicotine dependence due to vaping tobacco product 01/11/2020  ? Encounter for medical examination to establish care 11/29/2019  ? Body mass index (BMI) 32.0-32.9, adult 11/29/2019  ? Tobacco dependence 11/29/2019  ? Unstable angina (HCC)   ? Hyperlipidemia 10/03/2016  ? Muscle spasm 10/03/2016  ? Obesity (BMI 30.0-34.9) 01/15/2015  ? OSA (obstructive sleep apnea) 01/15/2015  ? Atypical chest pain 01/15/2015  ? ? ? ? ?Social History  ? reports that he has been smoking cigarettes. He has been smoking an average of .5 packs per day. He has never used smokeless tobacco. He reports current alcohol use of about 2.0 - 3.0 standard drinks per week. He reports that he does not use drugs.  ? ?Family History  ?family history includes Asthma in his  brother; Cancer in his brother and maternal grandmother; Early death in his brother; Heart disease in his father; Prostate cancer in his paternal grandmother.  ? ?Medications: reviewed and updated ?  ?Objective:  ? Physical Exam ?BP 113/80 (BP Location: Left Arm, Patient Position: Sitting, Cuff Size: Large)   Pulse (!) 102   Temp 98.3 ?F (36.8 ?C)   Resp 18   Ht 5\' 10"  (1.778 m)   Wt 250 lb (113.4 kg)   SpO2 95%   BMI 35.87 kg/m?  ? ?Physical Exam ?HENT:  ?   Head:  Normocephalic and atraumatic.  ?Eyes:  ?   Extraocular Movements: Extraocular movements intact.  ?   Conjunctiva/sclera: Conjunctivae normal.  ?   Pupils: Pupils are equal, round, and reactive to light.  ?Cardiovascular:  ?   Rate and Rhythm: Tachycardia present.  ?   Pulses: Normal pulses.  ?   Heart sounds: Normal heart sounds.  ?Pulmonary:  ?   Effort: Pulmonary effort is normal.  ?   Breath sounds: Normal breath sounds.  ?Musculoskeletal:  ?   Cervical back: Normal range of motion and neck supple.  ?   Comments: Right lower extremity cam walker in place.  ?Neurological:  ?   General: No focal deficit present.  ?   Mental Status: He is alert and oriented to person, place, and time.  ?Psychiatric:     ?   Mood and Affect: Mood normal.     ?   Behavior: Behavior normal.  ? ?   ?Assessment & Plan:  ?1. Encounter to establish care: ?- Patient presents today to establish care.  ?- Return for annual physical examination, labs, and health maintenance. Arrive fasting meaning having no food for at least 8 hours prior to appointment. You may have only water or black coffee. Please take scheduled medications as normal. ? ?2. Closed fracture of distal end of right tibia with routine healing, unspecified fracture morphology, subsequent encounter: ?3. Contusion of thoracic spine: ?4. Concussion with loss of consciousness of 30 minutes or less, subsequent encounter: ?- Patient reports waiting for worker's compensation to tell him where he should be seen therefore, elects holding referral to Orthopedics at this time.  ? ? ?Patient was given clear instructions to go to Emergency Department or return to medical center if symptoms don't improve, worsen, or new problems develop.The patient verbalized understanding. ? ?I discussed the assessment and treatment plan with the patient. The patient was provided an opportunity to ask questions and all were answered. The patient agreed with the plan and demonstrated an understanding of the  instructions. ?  ?The patient was advised to call back or seek an in-person evaluation if the symptoms worsen or if the condition fails to improve as anticipated. ? ? ? ?Durene Fruits, NP ?07/12/2021, 1:52 PM ?Primary Care at [facility]  ? ?

## 2021-07-11 ENCOUNTER — Other Ambulatory Visit: Payer: Self-pay

## 2021-07-11 ENCOUNTER — Emergency Department: Payer: No Typology Code available for payment source

## 2021-07-11 ENCOUNTER — Encounter: Payer: Self-pay | Admitting: Intensive Care

## 2021-07-11 ENCOUNTER — Emergency Department
Admission: EM | Admit: 2021-07-11 | Discharge: 2021-07-11 | Disposition: A | Discharge status: Home / Self Care | Payer: No Typology Code available for payment source | Attending: Emergency Medicine | Admitting: Emergency Medicine

## 2021-07-11 DIAGNOSIS — W19XXXA Unspecified fall, initial encounter: Secondary | ICD-10-CM

## 2021-07-11 DIAGNOSIS — W11XXXA Fall on and from ladder, initial encounter: Secondary | ICD-10-CM | POA: Diagnosis not present

## 2021-07-11 DIAGNOSIS — S20229A Contusion of unspecified back wall of thorax, initial encounter: Secondary | ICD-10-CM | POA: Insufficient documentation

## 2021-07-11 DIAGNOSIS — Y990 Civilian activity done for income or pay: Secondary | ICD-10-CM | POA: Insufficient documentation

## 2021-07-11 DIAGNOSIS — S82301A Unspecified fracture of lower end of right tibia, initial encounter for closed fracture: Secondary | ICD-10-CM

## 2021-07-11 DIAGNOSIS — S060X1A Concussion with loss of consciousness of 30 minutes or less, initial encounter: Secondary | ICD-10-CM | POA: Diagnosis not present

## 2021-07-11 DIAGNOSIS — S0990XA Unspecified injury of head, initial encounter: Secondary | ICD-10-CM | POA: Diagnosis present

## 2021-07-11 DIAGNOSIS — S89101A Unspecified physeal fracture of lower end of right tibia, initial encounter for closed fracture: Secondary | ICD-10-CM | POA: Diagnosis not present

## 2021-07-11 LAB — BASIC METABOLIC PANEL
Anion gap: 9 (ref 5–15)
BUN: 14 mg/dL (ref 6–20)
CO2: 26 mmol/L (ref 22–32)
Calcium: 9.2 mg/dL (ref 8.9–10.3)
Chloride: 104 mmol/L (ref 98–111)
Creatinine, Ser: 0.81 mg/dL (ref 0.61–1.24)
GFR, Estimated: >60 mL/min (ref >60)
Glucose, Bld: 108 mg/dL — ABNORMAL HIGH (ref 70–99)
Potassium: 4 mmol/L (ref 3.5–5.1)
Sodium: 139 mmol/L (ref 135–145)

## 2021-07-11 LAB — URINALYSIS, ROUTINE W REFLEX MICROSCOPIC
Bilirubin Urine: NEGATIVE
Glucose, UA: NEGATIVE mg/dL
Hgb urine dipstick: NEGATIVE
Ketones, ur: NEGATIVE mg/dL
Leukocytes,Ua: NEGATIVE
Nitrite: NEGATIVE
Protein, ur: NEGATIVE mg/dL
Specific Gravity, Urine: 1.021 (ref 1.005–1.030)
pH: 7 (ref 5.0–8.0)

## 2021-07-11 LAB — CBC
HCT: 44.7 % (ref 39.0–52.0)
Hemoglobin: 14.6 g/dL (ref 13.0–17.0)
MCH: 27.4 pg (ref 26.0–34.0)
MCHC: 32.7 g/dL (ref 30.0–36.0)
MCV: 84 fL (ref 80.0–100.0)
Platelets: 199 10*3/uL (ref 150–400)
RBC: 5.32 MIL/uL (ref 4.22–5.81)
RDW: 13.2 % (ref 11.5–15.5)
WBC: 7.4 10*3/uL (ref 4.0–10.5)
nRBC: 0 % (ref 0.0–0.2)

## 2021-07-11 MED ORDER — KETOROLAC TROMETHAMINE 30 MG/ML IJ SOLN
30.0000 mg | Freq: Once | INTRAMUSCULAR | Status: AC
  Administered 2021-07-11: 30 mg via INTRAVENOUS
  Filled 2021-07-11: qty 1

## 2021-07-11 MED ORDER — METOCLOPRAMIDE HCL 5 MG/ML IJ SOLN
10.0000 mg | Freq: Once | INTRAMUSCULAR | Status: AC
  Administered 2021-07-11: 10 mg via INTRAVENOUS
  Filled 2021-07-11: qty 2

## 2021-07-11 MED ORDER — SODIUM CHLORIDE 0.9 % IV BOLUS
1000.0000 mL | Freq: Once | INTRAVENOUS | Status: AC
  Administered 2021-07-11: 1000 mL via INTRAVENOUS

## 2021-07-11 NOTE — ED Notes (Signed)
Patient transported to X-ray 

## 2021-07-11 NOTE — ED Notes (Signed)
Unable to speak to pt's place of employment, pt dc'd and told to follow up with place of employment to file workman's comp.  ?

## 2021-07-11 NOTE — ED Notes (Signed)
Pt is a workers comp case, pt works for [employer]. No WC profile noted in Sharepoint.  ? ?HR Representative Dennie Bible [phone] ?

## 2021-07-11 NOTE — ED Notes (Addendum)
Spoke with Evalina Field at  [employer], [phone]. Marcie Bal to speak to HR and will follow up.  ?

## 2021-07-11 NOTE — ED Notes (Signed)
Print production planner has not called this RN to inform her of workman's comp orders, this RN to proceed with blood and urine collection as ordered.  ?

## 2021-07-11 NOTE — ED Notes (Signed)
Per EDP pt may eat and drink, pt given ice water per request. ?

## 2021-07-11 NOTE — ED Provider Notes (Signed)
? ?[facility] ?Provider Note ? ? ? Event Date/Time  ? First MD Initiated Contact with Patient 07/11/21 1108   ?  (approximate) ? ? ?History  ? ?Fall and Headache ? ? ?HPI ? ?Collie Wernick is a 46 y.o. male who is otherwise healthy presents emergency department after a fall from a 6 foot ladder yesterday at a air hunger and he fell onto reinforced concrete.  Patient states he fell straight back, has had a headache since the fall, is also complaining of neck and upper back pain along with right knee pain.  Positive LOC yesterday.  No vomiting.  States I just feel like it is a hangover type headache.  Patient was at work and is following Teacher, adult education. ? ?  ? ? ?Physical Exam  ? ?Triage Vital Signs: ?ED Triage Vitals  ?Enc Vitals Group  ?   BP 07/11/21 0946 (!) 126/107  ?   Pulse Rate 07/11/21 0946 (!) 105  ?   Resp 07/11/21 0946 18  ?   Temp 07/11/21 0946 99.2 ?F (37.3 ?C)  ?   Temp Source 07/11/21 0946 Oral  ?   SpO2 07/11/21 0946 (!) 89 %  ?   Weight 07/11/21 0947 250 lb (113.4 kg)  ?   Height 07/11/21 0947 6\' 3"  (1.905 m)  ?   Head Circumference --   ?   Peak Flow --   ?   Pain Score 07/11/21 0947 5  ?   Pain Loc --   ?   Pain Edu? --   ?   Excl. in GC? --   ? ? ?Most recent vital signs: ?Vitals:  ? 07/11/21 1210 07/11/21 1234  ?BP:  101/65  ?Pulse: 61 63  ?Resp: 13 10  ?Temp:    ?SpO2: 98% 96%  ? ? ? ?General: Awake, no distress.   ?CV:  Good peripheral perfusion. regular rate and  rhythm ?Resp:  Normal effort. Lungs CTA ?Abd:  No distention.   ?Other:  C-spine, T-spine tender to palpation, right knee tender to palpation, right ankle tender to palpation, neurovascular is intact ? ? ?ED Results / Procedures / Treatments  ? ?Labs ?(all labs ordered are listed, but only abnormal results are displayed) ?Labs Reviewed  ?BASIC METABOLIC PANEL - Abnormal; Notable for the following components:  ?    Result Value  ? Glucose, Bld 108 (*)   ? All other components within normal limits  ?URINALYSIS,  ROUTINE W REFLEX MICROSCOPIC - Abnormal; Notable for the following components:  ? Color, Urine YELLOW (*)   ? APPearance CLOUDY (*)   ? All other components within normal limits  ?CBC  ? ? ? ?EKG ? ?EKG ? ? ?RADIOLOGY ?CT of the head. x-rays, T-spine, right knee, and right ? ? ? ?PROCEDURES: ? ? ?Procedures ? ? ?MEDICATIONS ORDERED IN ED: ?Medications  ?ketorolac (TORADOL) 30 MG/ML injection 30 mg (30 mg Intravenous Given 07/11/21 1130)  ?sodium chloride 0.9 % bolus 1,000 mL (1,000 mLs Intravenous New Bag/Given 07/11/21 1129)  ?metoCLOPramide (REGLAN) injection 10 mg (10 mg Intravenous Given 07/11/21 1130)  ? ? ? ?IMPRESSION / MDM / ASSESSMENT AND PLAN / ED COURSE  ?I reviewed the triage vital signs and the nursing notes. ?             ?               ? ?Differential diagnosis includes, but is not limited to, subdural, SAH, skull fracture, C-spine fracture,  T-spine fracture, right knee fracture, right ankle fracture, multiple contusions, concussion ? ?EKG shows normal sinus rhythm, see physician read ? ?CT of the head was independently reviewed by me, I do not see any acute abnormality.  Read as no intracranial abnormality by radiologist. ? ?X-rays of the C-spine, T-spine, right knee and right ankle were ordered. ? ?X-ray of the C-spine, T-spine, right knee were independently reviewed by me and do not show fractures.  Confirmed by radiology. ?X-ray of the right ankle independently reviewed by me and I do agree with the radiologist on history that could be a fracture posteriorly as the patient is quite tender at the distal tibia. ? ?Patient was placed in a cam boot.  He is to elevate and ice.  Follow-up with orthopedics.  He also has a concussion and so Worker's Comp. restrictions did include no climbing no crawling, no walking, restrict noise in his environment, and limit bluelight screen time to 2 to 4 hours/day for 1 week.  For walking and putting weight on the right ankle he should avoid putting any weight on the  ankle until evaluated by orthopedics.  I did offer the patient pain medication.  He states he would only take Tylenol or ibuprofen if needed.  He was discharged stable condition. ? ? ? ? ?  ? ? ?FINAL CLINICAL IMPRESSION(S) / ED DIAGNOSES  ? ?Final diagnoses:  ?Fall, initial encounter  ?Concussion with loss of consciousness of 30 minutes or less, initial encounter  ?Closed fracture of distal end of right tibia, unspecified fracture morphology, initial encounter  ?Contusion of thoracic spine  ? ? ? ?Rx / DC Orders  ? ?ED Discharge Orders   ? ? None  ? ?  ? ? ? ?Note:  This document was prepared using Dragon voice recognition software and may include unintentional dictation errors. ? ?  ?Faythe Ghee, PA-C ?07/11/21 1307 ? ?  ?Merwyn Katos, MD ?07/11/21 1533 ? ?

## 2021-07-11 NOTE — ED Notes (Addendum)
Pt's workplace called at 1130 back to give this RN information about workman's comp,  RN unable to take information at that time, this RN asked workplace to call back in 10 minutes, workplace has not called back at this time, pt waiting to find out next steps ?

## 2021-07-11 NOTE — Discharge Instructions (Addendum)
Follow-up with orthopedics.  Dr. Posey Pronto is on-call for orthopedics.  He can follow-up with [facility] clinic as long as your Worker's Comp. insurance approves of this physician.  If not they are responsible for getting you to an orthopedist within a week to recheck the right ankle fracture. ?Take Tylenol or ibuprofen for pain as needed.  Apply ice and elevate the ankle.  Bear weight as tolerated. ?Return to the emergency department if worsening. ?For the concussion please limit your screen time to 2 to 4 hours/day.  This includes television, iPhone, computers etc.  The bluelight will increase headaches.  Also quiet calm environment helps. ?Restrictions for concussion for 1 week ?Restrictions for ankle are until evaluated by orthopedics ?

## 2021-07-11 NOTE — ED Notes (Signed)
This RN attempted to call HR for pt to get info on workman's comp, no answer.  ?

## 2021-07-11 NOTE — ED Triage Notes (Signed)
Patient reports he fell yesterday from ladder 6 ft up. Denies vision changed. Reports LOC. Describes feeling today as "hungover." C/o head, right knee pain and back pain. Drove self to ER ?

## 2021-07-12 ENCOUNTER — Encounter: Payer: Self-pay | Admitting: Family

## 2021-07-12 ENCOUNTER — Ambulatory Visit (INDEPENDENT_AMBULATORY_CARE_PROVIDER_SITE_OTHER): Payer: 59 | Admitting: Family

## 2021-07-12 VITALS — BP 113/80 | HR 102 | Temp 98.3°F | Resp 18 | Ht 70.0 in | Wt 250.0 lb

## 2021-07-12 DIAGNOSIS — S20229A Contusion of unspecified back wall of thorax, initial encounter: Secondary | ICD-10-CM | POA: Diagnosis not present

## 2021-07-12 DIAGNOSIS — S060X1D Concussion with loss of consciousness of 30 minutes or less, subsequent encounter: Secondary | ICD-10-CM

## 2021-07-12 DIAGNOSIS — Z7689 Persons encountering health services in other specified circumstances: Secondary | ICD-10-CM

## 2021-07-12 DIAGNOSIS — S82301D Unspecified fracture of lower end of right tibia, subsequent encounter for closed fracture with routine healing: Secondary | ICD-10-CM

## 2021-07-12 NOTE — Patient Instructions (Signed)
Thank you for choosing Primary Care at [facility] for your medical home!   ? ?Taylor Dalton was seen by Taylor Fendt, NP today.  ? ?Taylor Dalton primary care provider is Taylor Stabs, NP.   ?For the best care possible,  you should try to see Taylor Stabs, NP ?whenever you come to office.  ? ?We look forward to seeing you again soon! ? ?If you have any questions about your visit today,  ?please call us at [phone] ? ?Or feel free to reach your provider via MyChart.   ?Keeping you healthy ?  ?Get these tests ?Blood pressure- Have your blood pressure checked once a year by your healthcare provider.  Normal blood pressure is 120/80. ?Weight- Have your body mass index (BMI) calculated to screen for obesity.  BMI is a measure of body fat based on height and weight. You can also calculate your own BMI at https://www.west-esparza.com/. ?Cholesterol- Have your cholesterol checked regularly starting at age 53, sooner may be necessary if you have diabetes, high blood pressure, if a family member developed heart diseases at an early age or if you smoke.  ?Chlamydia, HIV, and other sexual transmitted disease- Get screened each year until the age of 27 then within three months of each new sexual partner. ?Diabetes- Have your blood sugar checked regularly if you have high blood pressure, high cholesterol, a family history of diabetes or if you are overweight. ?  ?Get these vaccines ?Flu shot- Every fall. ?Tetanus shot- Every 10 years. ?Menactra- Single dose; prevents meningitis. ?  ?Take these steps ?Don't smoke- If you do smoke, ask your healthcare provider about quitting. For tips on how to quit, go to www.smokefree.gov or call 1-800-QUIT-NOW. ?Be physically active- Exercise 5 days a week for at least 30 minutes.  If you are not already physically active start slow and gradually work up to 30 minutes of moderate physical activity.  Examples of moderate activity include walking briskly, mowing the yard, dancing, swimming  bicycling, etc. ?Eat a healthy diet- Eat a variety of healthy foods such as fruits, vegetables, low fat milk, low fat cheese, yogurt, lean meats, poultry, fish, beans, tofu, etc.  For more information on healthy eating, go to www.thenutritionsource.org ?Drink alcohol in moderation- Limit alcohol intake two drinks or less a day.  Never drink and drive. ?Dentist- Brush and floss teeth twice daily; visit your dentis twice a year. ?Depression-Your emotional health is as important as your physical health.  If you're feeling down, losing interest in things you normally enjoy please talk with your healthcare provider. ?Arboriculturist- If you keep a gun in your home, keep it unloaded and with the safety lock on.  Bullets should be stored separately. ?Helmet use- Always wear a helmet when riding a motorcycle, bicycle, rollerblading or skateboarding. ?Safe sex- If you may be exposed to a sexually transmitted infection, use a condom ?Seat belts- Seat bels can save your life; always wear one. ?Smoke/Carbon Monoxide detectors- These detectors need to be installed on the appropriate level of your home.  Replace batteries at least once a year. ?Skin Cancer- When out in the sun, cover up and use sunscreen SPF 15 or higher. ?Violence- If anyone is threatening or hurting you, please tell your healthcare provider. ? ?

## 2021-07-12 NOTE — Progress Notes (Signed)
Pt presents to establish care 

## 2021-08-17 NOTE — Progress Notes (Signed)
? ? ?Patient ID: Taylor Dalton, male    DOB: 1975-12-17  MRN: [id number] ? ?CC: Annual Physical Exam ? ?Subjective: ?Taylor Dalton is a 46 y.o. male who presents for annual physical exam.  ? ?His concerns today include:  ?Would like testosterone screening. No further issues/concerns. ? ? ?Patient Active Problem List  ? Diagnosis Date Noted  ? History of COVID-19 01/20/2020  ? Tachycardia 01/20/2020  ? Acute non-recurrent sinusitis 01/16/2020  ? COVID-19 virus infection 01/11/2020  ? Nicotine dependence due to vaping tobacco product 01/11/2020  ? Encounter for medical examination to establish care 11/29/2019  ? Body mass index (BMI) 32.0-32.9, adult 11/29/2019  ? Tobacco dependence 11/29/2019  ? Unstable angina (HCC)   ? Hyperlipidemia 10/03/2016  ? Muscle spasm 10/03/2016  ? Obesity (BMI 30.0-34.9) 01/15/2015  ? OSA (obstructive sleep apnea) 01/15/2015  ? Atypical chest pain 01/15/2015  ?  ? ?Current Outpatient Medications on File Prior to Visit  ?Medication Sig Dispense Refill  ? albuterol (VENTOLIN HFA) 108 (90 Base) MCG/ACT inhaler Inhale 2 puffs into the lungs every 6 (six) hours as needed for wheezing or shortness of breath. 8 g 0  ? cyclobenzaprine (FLEXERIL) 10 MG tablet Take 1 tablet (10 mg total) by mouth at bedtime as needed for muscle spasms. 30 tablet 0  ? phentermine 37.5 MG capsule Take 37.5 mg by mouth daily.    ? ?No current facility-administered medications on file prior to visit.  ? ? ?No Known Allergies ? ?Social History  ? ?Socioeconomic History  ? Marital status: Married  ?  Spouse name: Not on file  ? Number of children: Not on file  ? Years of education: Not on file  ? Highest education level: Not on file  ?Occupational History  ? Occupation: Control and instrumentation engineer [employer]  ?Tobacco Use  ? Smoking status: Every Day  ?  Packs/day: 0.50  ?  Types: Cigarettes  ?  Passive exposure: Never  ? Smokeless tobacco: Never  ? Tobacco comments:  ?  On and Off for 30 years.   ?Vaping Use  ? Vaping  Use: Every day  ?Substance and Sexual Activity  ? Alcohol use: Yes  ?  Alcohol/week: 2.0 - 3.0 standard drinks  ?  Types: 2 - 3 Standard drinks or equivalent per week  ? Drug use: No  ? Sexual activity: Yes  ?Other Topics Concern  ? Not on file  ?Social History Narrative  ? Married lives locally, No pets, 6 children and 4 adults still living with them.   ? ?Social Determinants of Health  ? ?Financial Resource Strain: Not on file  ?Food Insecurity: Not on file  ?Transportation Needs: Not on file  ?Physical Activity: Not on file  ?Stress: Not on file  ?Social Connections: Not on file  ?Intimate Partner Violence: Not on file  ? ? ?Family History  ?Problem Relation Age of Onset  ? Prostate cancer Paternal Grandmother   ? Heart disease Father   ? Cancer Brother   ? Early death Brother   ? Cancer Maternal Grandmother   ? Asthma Brother   ? ? ?Past Surgical History:  ?Procedure Laterality Date  ? KNEE SURGERY Right   ? ? ?ROS: ?Review of Systems ?Negative except as stated above ? ?PHYSICAL EXAM: ?BP 122/86 (BP Location: Left Arm, Patient Position: Sitting, Cuff Size: Large)   Pulse (!) 113   Temp 98.3 ?F (36.8 ?C)   Resp 18   Ht 6' 2.37" (1.889 m)   Wt  258 lb (117 kg)   SpO2 95%   BMI 32.80 kg/m?  ? ?Physical Exam ?HENT:  ?   Head: Normocephalic and atraumatic.  ?   Right Ear: Tympanic membrane, ear canal and external ear normal.  ?   Left Ear: Tympanic membrane, ear canal and external ear normal.  ?   Nose: Nose normal.  ?   Mouth/Throat:  ?   Mouth: Mucous membranes are moist.  ?   Pharynx: Oropharynx is clear.  ?Eyes:  ?   Extraocular Movements: Extraocular movements intact.  ?   Conjunctiva/sclera: Conjunctivae normal.  ?   Pupils: Pupils are equal, round, and reactive to light.  ?Cardiovascular:  ?   Rate and Rhythm: Tachycardia present.  ?   Pulses: Normal pulses.  ?   Heart sounds: Normal heart sounds.  ?Pulmonary:  ?   Effort: Pulmonary effort is normal.  ?   Breath sounds: Normal breath sounds.  ?Abdominal:   ?   General: Bowel sounds are normal.  ?   Palpations: Abdomen is soft.  ?Genitourinary: ?   Comments: Patient declined.  ?Musculoskeletal:     ?   General: Normal range of motion.  ?   Cervical back: Normal range of motion and neck supple.  ?Skin: ?   General: Skin is warm and dry.  ?   Capillary Refill: Capillary refill takes less than 2 seconds.  ?Neurological:  ?   General: No focal deficit present.  ?   Mental Status: He is alert and oriented to person, place, and time.  ?Psychiatric:     ?   Mood and Affect: Mood normal.     ?   Behavior: Behavior normal.  ? ?ASSESSMENT AND PLAN: ?1. Annual physical exam: ?- Counseled on 150 minutes of exercise per week as tolerated, healthy eating (including decreased daily intake of saturated fats, cholesterol, added sugars, sodium), STI prevention, and routine healthcare maintenance. ?- Testosterone screening per patient request.  ?- Testosterone ? ?2. Screening for metabolic disorder: ?- Screening liver function.  ?- Hepatic Function Panel ? ?3. Diabetes mellitus screening: ?- Hemoglobin A1c to screen for pre-diabetes/diabetes. ?- Hemoglobin A1c ? ?4. Screening cholesterol level: ?- Lipid panel to screen for high cholesterol.  ?- Lipid panel ? ?5. Thyroid disorder screen: ?- TSH to check thyroid function.  ?- TSH ? ?6. Need for hepatitis C screening test: ?- Hepatitis C antibody to screen for hepatitis C.  ?- Hepatitis C Antibody ? ?7. Colon cancer screening: ?- Referral to Gastroenterology for colon cancer screening by colonoscopy. ?- Ambulatory referral to Gastroenterology ? ? ?Patient was given the opportunity to ask questions.  Patient verbalized understanding of the plan and was able to repeat key elements of the plan. Patient was given clear instructions to go to Emergency Department or return to medical center if symptoms don't improve, worsen, or new problems develop.The patient verbalized understanding. ? ? ?Orders Placed This Encounter  ?Procedures  ? Hepatitis  C Antibody  ? Hepatic Function Panel  ? Lipid panel  ? TSH  ? Hemoglobin A1c  ? Testosterone  ? Ambulatory referral to Gastroenterology  ? ? ? ?Return in about 1 year (around 08/24/2022) for Physical per patient preference. ? ?Rema Fendt, NP  ?

## 2021-08-23 ENCOUNTER — Encounter: Payer: Self-pay | Admitting: Family

## 2021-08-23 ENCOUNTER — Ambulatory Visit (INDEPENDENT_AMBULATORY_CARE_PROVIDER_SITE_OTHER): Payer: 59 | Admitting: Family

## 2021-08-23 VITALS — BP 122/86 | HR 113 | Temp 98.3°F | Resp 18 | Ht 74.37 in | Wt 258.0 lb

## 2021-08-23 DIAGNOSIS — Z1211 Encounter for screening for malignant neoplasm of colon: Secondary | ICD-10-CM | POA: Diagnosis not present

## 2021-08-23 DIAGNOSIS — Z1159 Encounter for screening for other viral diseases: Secondary | ICD-10-CM | POA: Diagnosis not present

## 2021-08-23 DIAGNOSIS — Z13220 Encounter for screening for lipoid disorders: Secondary | ICD-10-CM

## 2021-08-23 DIAGNOSIS — Z13228 Encounter for screening for other metabolic disorders: Secondary | ICD-10-CM | POA: Diagnosis not present

## 2021-08-23 DIAGNOSIS — Z0000 Encounter for general adult medical examination without abnormal findings: Secondary | ICD-10-CM | POA: Diagnosis not present

## 2021-08-23 DIAGNOSIS — Z1329 Encounter for screening for other suspected endocrine disorder: Secondary | ICD-10-CM

## 2021-08-23 DIAGNOSIS — Z131 Encounter for screening for diabetes mellitus: Secondary | ICD-10-CM | POA: Diagnosis not present

## 2021-08-23 NOTE — Progress Notes (Signed)
Pt presents for annual physical  ?Desires have testosterone levels checked  ?

## 2021-08-23 NOTE — Patient Instructions (Signed)

## 2021-08-24 ENCOUNTER — Other Ambulatory Visit: Payer: Self-pay | Admitting: Family

## 2021-08-24 DIAGNOSIS — E785 Hyperlipidemia, unspecified: Secondary | ICD-10-CM

## 2021-08-24 LAB — HEPATIC FUNCTION PANEL
ALT: 45 IU/L — ABNORMAL HIGH (ref 0–44)
AST: 25 IU/L (ref 0–40)
Albumin: 4.8 g/dL (ref 4.0–5.0)
Alkaline Phosphatase: 93 IU/L (ref 44–121)
Bilirubin Total: 0.3 mg/dL (ref 0.0–1.2)
Bilirubin, Direct: 0.1 mg/dL (ref 0.00–0.40)
Total Protein: 7.3 g/dL (ref 6.0–8.5)

## 2021-08-24 LAB — LIPID PANEL
Chol/HDL Ratio: 4.9 ratio (ref 0.0–5.0)
Cholesterol, Total: 246 mg/dL — ABNORMAL HIGH (ref 100–199)
HDL: 50 mg/dL (ref >39)
LDL Chol Calc (NIH): 176 mg/dL — ABNORMAL HIGH (ref 0–99)
Triglycerides: 110 mg/dL (ref 0–149)
VLDL Cholesterol Cal: 20 mg/dL (ref 5–40)

## 2021-08-24 LAB — HEPATITIS C ANTIBODY: Hep C Virus Ab: NONREACTIVE

## 2021-08-24 LAB — HEMOGLOBIN A1C
Est. average glucose Bld gHb Est-mCnc: 114 mg/dL
Hgb A1c MFr Bld: 5.6 % (ref 4.8–5.6)

## 2021-08-24 LAB — TSH: TSH: 1.24 u[IU]/mL (ref 0.450–4.500)

## 2021-08-24 MED ORDER — ATORVASTATIN CALCIUM 20 MG PO TABS: 20.0000 mg | ORAL_TABLET | Freq: Every day | ORAL | 1 refills | Status: AC

## 2021-08-24 NOTE — Progress Notes (Signed)
-   Liver function normal. ?- Thyroid function normal.  ?- No diabetes.  ?- Hepatitis C negative.  ? ?The following abnormalities are noted:  ?- Cholesterol higher than expected. High cholesterol may increase risk of heart attack and/or stroke. Consider eating more fruits, vegetables, and lean baked meats such as chicken or fish. Moderate intensity exercise at least 150 minutes as tolerated per week may help as well.  ? ?All other values are normal, stable or within acceptable limits. ? ?Medication changes / Follow up labs / Other changes or recommendations:   ?- Begin Atorvastatin for high cholesterol. Recheck fasting cholesterol in 4 to 6 weeks.  ? ?The following is for provider reference only: ?The 10-year ASCVD risk score (Arnett DK, et al., 2019) is: 7.5% ?  Values used to calculate the score: ?    Age: 46 years ?    Sex: Male ?    Is Non-Hispanic African American: No ?    Diabetic: No ?    Tobacco smoker: Yes ?    Systolic Blood Pressure: 123XX123 mmHg ?    Is BP treated: No ?    HDL Cholesterol: 50 mg/dL ?    Total Cholesterol: 246 mg/dL ? ?Camillia Herter, NP 08/24/2021 8:09 AM  ?

## 2021-09-18 ENCOUNTER — Other Ambulatory Visit: Payer: Self-pay | Admitting: Family

## 2021-09-18 ENCOUNTER — Encounter: Payer: Self-pay | Admitting: Family

## 2021-09-18 DIAGNOSIS — E349 Endocrine disorder, unspecified: Secondary | ICD-10-CM

## 2021-09-25 ENCOUNTER — Telehealth: Payer: Self-pay | Admitting: Family

## 2021-09-25 NOTE — Telephone Encounter (Signed)
Called pt to inform him that he needs to call the office and make a Lab appt. No answer so left VM to call clinic to schedule.

## 2021-09-25 NOTE — Telephone Encounter (Signed)
Called pt to inform him that he needs to make a lab appt. No answer so I left a VM for pt to call back to schedule.

## 2021-12-18 ENCOUNTER — Other Ambulatory Visit: Payer: Self-pay

## 2021-12-18 ENCOUNTER — Emergency Department: Payer: 59

## 2021-12-18 ENCOUNTER — Encounter: Payer: Self-pay | Admitting: *Deleted

## 2021-12-18 DIAGNOSIS — R0602 Shortness of breath: Secondary | ICD-10-CM | POA: Diagnosis not present

## 2021-12-18 DIAGNOSIS — Z8616 Personal history of COVID-19: Secondary | ICD-10-CM | POA: Insufficient documentation

## 2021-12-18 DIAGNOSIS — R0789 Other chest pain: Secondary | ICD-10-CM | POA: Diagnosis not present

## 2021-12-18 DIAGNOSIS — R079 Chest pain, unspecified: Secondary | ICD-10-CM | POA: Diagnosis not present

## 2021-12-18 LAB — BASIC METABOLIC PANEL
Anion gap: 7 (ref 5–15)
BUN: 10 mg/dL (ref 6–20)
CO2: 26 mmol/L (ref 22–32)
Calcium: 9.2 mg/dL (ref 8.9–10.3)
Chloride: 107 mmol/L (ref 98–111)
Creatinine, Ser: 1.02 mg/dL (ref 0.61–1.24)
GFR, Estimated: >60 mL/min (ref >60)
Glucose, Bld: 117 mg/dL — ABNORMAL HIGH (ref 70–99)
Potassium: 4 mmol/L (ref 3.5–5.1)
Sodium: 140 mmol/L (ref 135–145)

## 2021-12-18 LAB — CBC
HCT: 49.4 % (ref 39.0–52.0)
Hemoglobin: 16 g/dL (ref 13.0–17.0)
MCH: 27.6 pg (ref 26.0–34.0)
MCHC: 32.4 g/dL (ref 30.0–36.0)
MCV: 85.2 fL (ref 80.0–100.0)
Platelets: 221 10*3/uL (ref 150–400)
RBC: 5.8 MIL/uL (ref 4.22–5.81)
RDW: 13.6 % (ref 11.5–15.5)
WBC: 9.2 10*3/uL (ref 4.0–10.5)
nRBC: 0 % (ref 0.0–0.2)

## 2021-12-18 LAB — TROPONIN I (HIGH SENSITIVITY): Troponin I (High Sensitivity): <2 ng/L (ref <18)

## 2021-12-18 NOTE — ED Triage Notes (Signed)
Pt reports chest pain since 2100 tonight  with sob.  No n/v/d  pt reports feel bad all over.  Pt alert  speech clear.

## 2021-12-19 ENCOUNTER — Emergency Department
Admission: EM | Admit: 2021-12-19 | Discharge: 2021-12-19 | Disposition: A | Discharge status: Home / Self Care | Payer: 59 | Attending: Emergency Medicine | Admitting: Emergency Medicine

## 2021-12-19 DIAGNOSIS — R079 Chest pain, unspecified: Secondary | ICD-10-CM

## 2021-12-19 LAB — TROPONIN I (HIGH SENSITIVITY): Troponin I (High Sensitivity): 3 ng/L (ref <18)

## 2021-12-19 LAB — D-DIMER, QUANTITATIVE: D-Dimer, Quant: 0.48 ug/mL-FEU (ref 0.00–0.50)

## 2021-12-19 MED ORDER — KETOROLAC TROMETHAMINE 30 MG/ML IJ SOLN
30.0000 mg | Freq: Once | INTRAMUSCULAR | Status: AC
  Administered 2021-12-19: 30 mg via INTRAVENOUS
  Filled 2021-12-19: qty 1

## 2021-12-19 MED ORDER — IBUPROFEN 800 MG PO TABS: 800.0000 mg | ORAL_TABLET | Freq: Once | ORAL | Status: DC

## 2021-12-19 NOTE — ED Provider Notes (Signed)
[facility] Provider Note    Event Date/Time   First MD Initiated Contact with Patient 12/19/21 [phone]     (approximate)   History   Chest Pain   HPI  Taylor Dalton is a 46 y.o. male with history of tachycardia after COVID 2 years ago who presents emergency department with left-sided chest tightness, shortness of breath and not feeling well that started tonight.  Also had some sharp pains in the left leg that have resolved.  No calf tenderness or calf swelling.  No history of PE or DVT.  No history of hypertension, diabetes.  Has been on atorvastatin for hyperlipidemia.  No known history of CAD.  Pain is not exertional, pleuritic or reproducible with palpation.  He denies any fevers or cough.  No nausea, vomiting, dizziness, diaphoresis.   History provided by patient and wife.    Past Medical History:  Diagnosis Date   Chicken pox    Headache     Past Surgical History:  Procedure Laterality Date   KNEE SURGERY Right     MEDICATIONS:  Prior to Admission medications   Medication Sig Start Date End Date Taking? Authorizing Provider  albuterol (VENTOLIN HFA) 108 (90 Base) MCG/ACT inhaler Inhale 2 puffs into the lungs every 6 (six) hours as needed for wheezing or shortness of breath. 01/16/20   Theadore Nan, NP  atorvastatin (LIPITOR) 20 MG tablet Take 1 tablet (20 mg total) by mouth daily. 08/24/21 02/20/22  Rema Fendt, NP  cyclobenzaprine (FLEXERIL) 10 MG tablet Take 1 tablet (10 mg total) by mouth at bedtime as needed for muscle spasms. 10/03/16   Tommie Sams, DO  phentermine 37.5 MG capsule Take 37.5 mg by mouth daily. 08/04/21   [provider]    Physical Exam   Triage Vital Signs: ED Triage Vitals  Enc Vitals Group     BP 12/18/21 2212 106/65     Pulse Rate 12/18/21 2212 (!) 102     Resp 12/18/21 2212 18     Temp 12/18/21 2212 98.5 F (36.9 C)     Temp Source 12/18/21 2212 Oral     SpO2 12/18/21 2212 95 %      Weight 12/18/21 2209 215 lb (97.5 kg)     Height 12/18/21 2209 6\' 2"  (1.88 m)     Head Circumference --      Peak Flow --      Pain Score 12/18/21 2209 6     Pain Loc --      Pain Edu? --      Excl. in GC? --     Most recent vital signs: Vitals:   12/19/21 0441 12/19/21 0600  BP: 107/84 107/71  Pulse: 66 (!) 52  Resp: 16 18  Temp: 97.8 F (36.6 C)   SpO2: 96% 94%    CONSTITUTIONAL: Alert and oriented and responds appropriately to questions. Well-appearing; well-nourished HEAD: Normocephalic, atraumatic EYES: Conjunctivae clear, pupils appear equal, sclera nonicteric ENT: normal nose; moist mucous membranes NECK: Supple, normal ROM CARD: RRR; S1 and S2 appreciated; no murmurs, no clicks, no rubs, no gallops RESP: Normal chest excursion without splinting or tachypnea; breath sounds clear and equal bilaterally; no wheezes, no rhonchi, no rales, no hypoxia or respiratory distress, speaking full sentences ABD/GI: Normal bowel sounds; non-distended; soft, non-tender, no rebound, no guarding, no peritoneal signs BACK: The back appears normal EXT: Normal ROM in all joints; no deformity noted, no edema; no cyanosis, no calf tenderness  or calf swelling SKIN: Normal color for age and race; warm; no rash on exposed skin NEURO: Moves all extremities equally, normal speech PSYCH: The patient's mood and manner are appropriate.   ED Results / Procedures / Treatments   LABS: (all labs ordered are listed, but only abnormal results are displayed) Labs Reviewed  BASIC METABOLIC PANEL - Abnormal; Notable for the following components:      Result Value   Glucose, Bld 117 (*)    All other components within normal limits  CBC  D-DIMER, QUANTITATIVE  TROPONIN I (HIGH SENSITIVITY)  TROPONIN I (HIGH SENSITIVITY)     EKG:  EKG Interpretation  Date/Time:  Wednesday December 18 2021 22:20:08 EDT Ventricular Rate:  95 PR Interval:  144 QRS Duration: 90 QT Interval:  342 QTC  Calculation: 429 R Axis:   17 Text Interpretation: Normal sinus rhythm Normal ECG When compared with ECG of 11-Jul-2021 09:48, No significant change was found Confirmed by Rochele Raring [phone]) on 12/19/2021 5:09:45 AM         RADIOLOGY: My personal review and interpretation of imaging: Chest x-ray clear.  I have personally reviewed all radiology reports.   DG Chest 2 View  Result Date: 12/18/2021 CLINICAL DATA:  Chest pain. EXAM: CHEST - 2 VIEW COMPARISON:  Chest x-ray 01/17/2020 FINDINGS: The heart size and mediastinal contours are within normal limits. Both lungs are clear. The visualized skeletal structures are unremarkable. IMPRESSION: No active cardiopulmonary disease. Electronically Signed   By: Darliss Cheney M.D.   On: 12/18/2021 22:38     PROCEDURES:  Critical Care performed: No     .1-3 Lead EKG Interpretation  Performed by: Masaru Chamberlin, Layla Maw, DO Authorized by: Jaylyn Iyer, Layla Maw, DO     Interpretation: normal     ECG rate:  66   ECG rate assessment: normal     Rhythm: sinus rhythm     Ectopy: none     Conduction: normal       IMPRESSION / MDM / ASSESSMENT AND PLAN / ED COURSE  I reviewed the triage vital signs and the nursing notes.    Patient here with complaints of chest pain, shortness of breath.  Did have some left leg sharp pain as well but this has resolved.  The patient is on the cardiac monitor to evaluate for evidence of arrhythmia and/or significant heart rate changes.   DIFFERENTIAL DIAGNOSIS (includes but not limited to):   ACS, PE, dissection, pneumonia, pneumothorax, musculoskeletal pain, CHF   Patient's presentation is most consistent with acute presentation with potential threat to life or bodily function.   PLAN: CBC, BMP, troponin x2, chest x-ray obtained from triage.  EKG nonischemic.  No leukocytosis.  Normal electrolytes and renal function.  Troponin x2 negative.  EKG nonischemic.  Chest x-ray reviewed and interpreted by myself and  radiology and shows no acute abnormality.  Given reports of pain with some discomfort in the left leg that has now resolved, will obtain a D-dimer to rule out PE.  Will give Toradol for pain control as pain seems atypical for ACS.   MEDICATIONS GIVEN IN ED: Medications  ketorolac (TORADOL) 30 MG/ML injection 30 mg (30 mg Intravenous Given 12/19/21 0545)     ED COURSE: Patient's D-dimer is negative.  Feeling better after Toradol.  He is comfortable with plan for discharge home.  I have placed a cardiology referral for outpatient follow-up.   At this time, I do not feel there is any life-threatening condition present. I reviewed  all nursing notes, vitals, pertinent previous records.  All lab and urine results, EKGs, imaging ordered have been independently reviewed and interpreted by myself.  I reviewed all available radiology reports from any imaging ordered this visit.  Based on my assessment, I feel the patient is safe to be discharged home without further emergent workup and can continue workup as an outpatient as needed. Discussed all findings, treatment plan as well as usual and customary return precautions.  They verbalize understanding and are comfortable with this plan.  Outpatient follow-up has been provided as needed.  All questions have been answered.    CONSULTS: Patient would prefer discharge home with outpatient follow-up.   OUTSIDE RECORDS REVIEWED: Reviewed patient's last family medicine note with Carlisle Cater in November 2020.       FINAL CLINICAL IMPRESSION(S) / ED DIAGNOSES   Final diagnoses:  Nonspecific chest pain     Rx / DC Orders   ED Discharge Orders          Ordered    Ambulatory referral to Cardiology        12/19/21 0622             Note:  This document was prepared using Dragon voice recognition software and may include unintentional dictation errors.   Kyrell Ruacho, Delice Bison, DO 12/19/21 [phone]

## 2022-04-17 DIAGNOSIS — M62838 Other muscle spasm: Secondary | ICD-10-CM | POA: Diagnosis not present

## 2022-04-17 DIAGNOSIS — M9901 Segmental and somatic dysfunction of cervical region: Secondary | ICD-10-CM | POA: Diagnosis not present

## 2022-04-17 DIAGNOSIS — M9902 Segmental and somatic dysfunction of thoracic region: Secondary | ICD-10-CM | POA: Diagnosis not present

## 2022-04-17 DIAGNOSIS — M546 Pain in thoracic spine: Secondary | ICD-10-CM | POA: Diagnosis not present

## 2024-02-27 IMAGING — CT CT HEAD W/O CM
4 series · 15 of 47 positions shown, 17 images · non-contrast
Comparison: None.

CLINICAL DATA: Fall from ladder, loss of consciousness



[Series 3: head wo · axial · 0.36mm/px · z∈[-52,+61]mm · 6 of 33 slices shown, 8 images]
[im 5/33  brain]
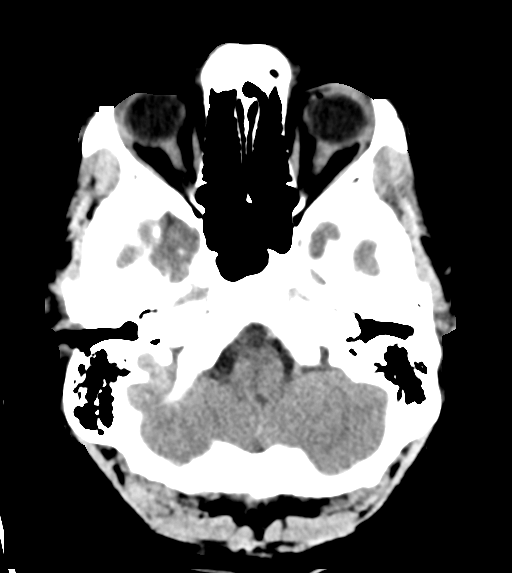
[im 5/33  bone]
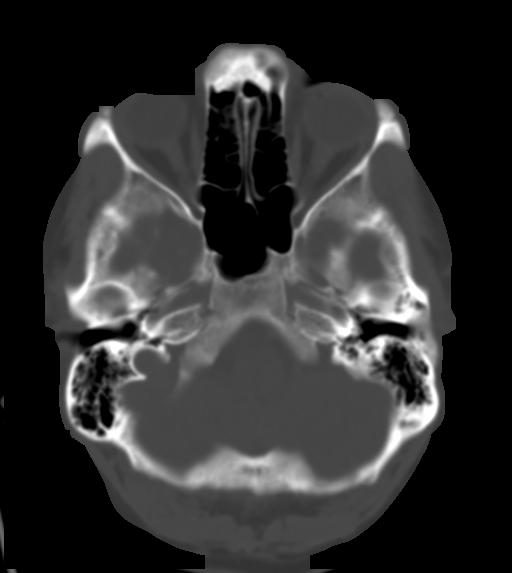
[im 10/33  brain]
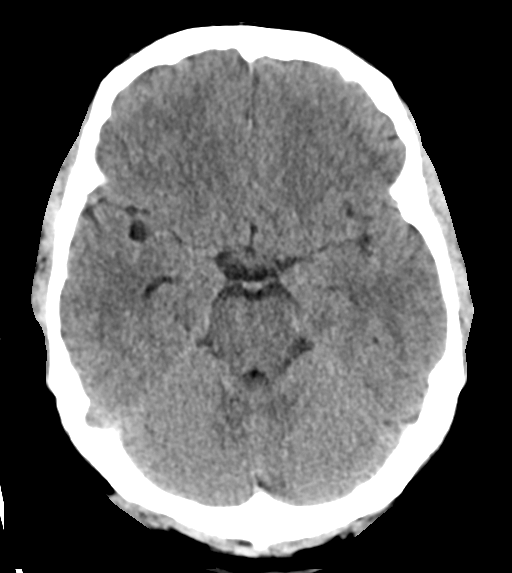
[im 14/33  brain]
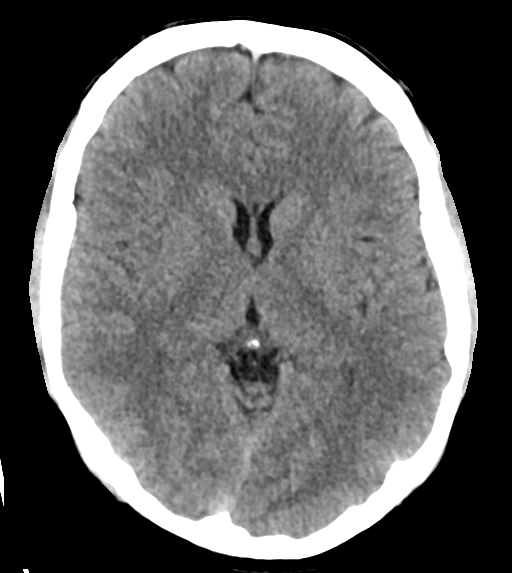
[im 19/33  brain]
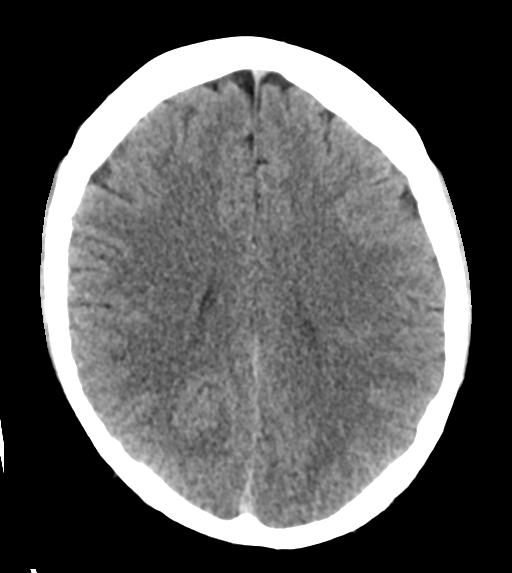
[im 23/33  brain]
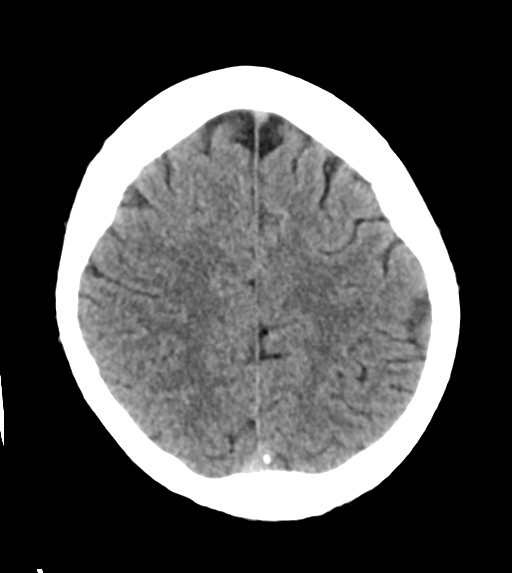
[im 23/33  bone]
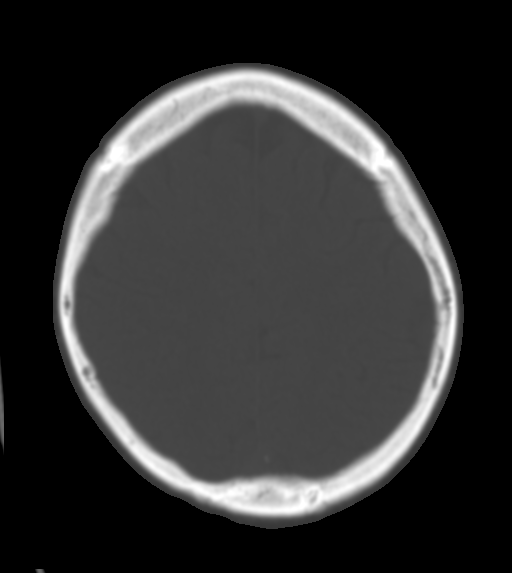
[im 28/33  brain]
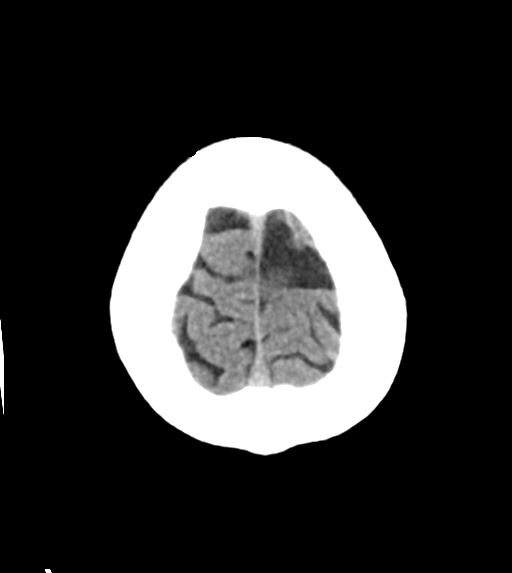

[Series 4: head bone · axial · 0.36mm/px · z∈[-59,-19]mm · 3 of 84 slices shown]
[im 8/84  bone]
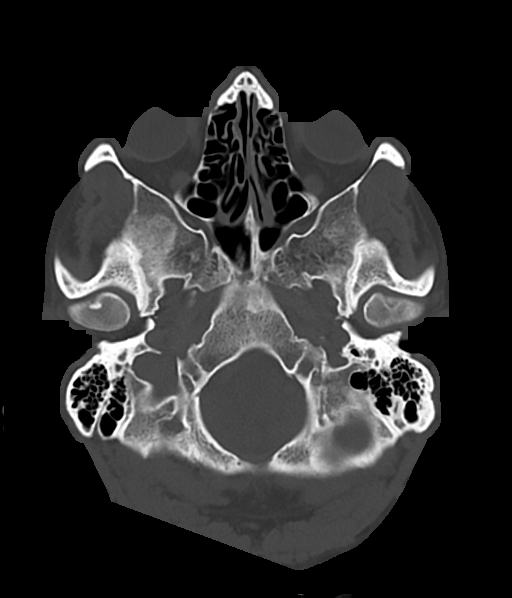
[im 16/84  bone]
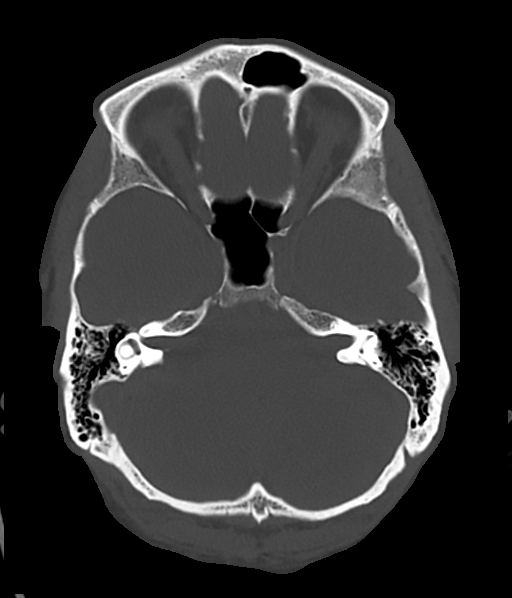
[im 28/84  bone]
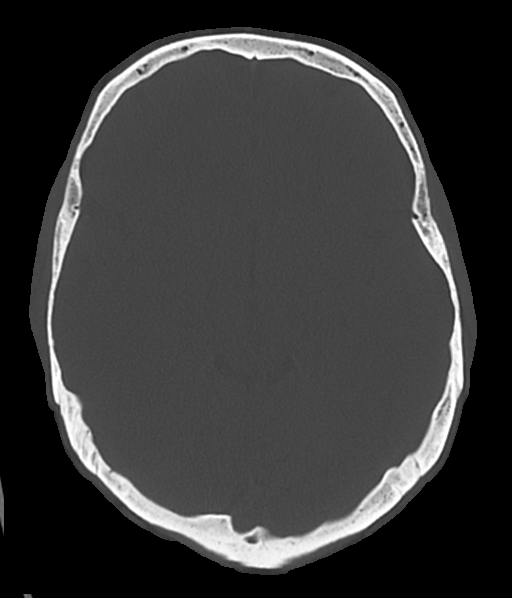

[Series 5: cor soft · coronal · 0.33mm/px · 3 of 69 slices shown]
[im 23/69  brain]
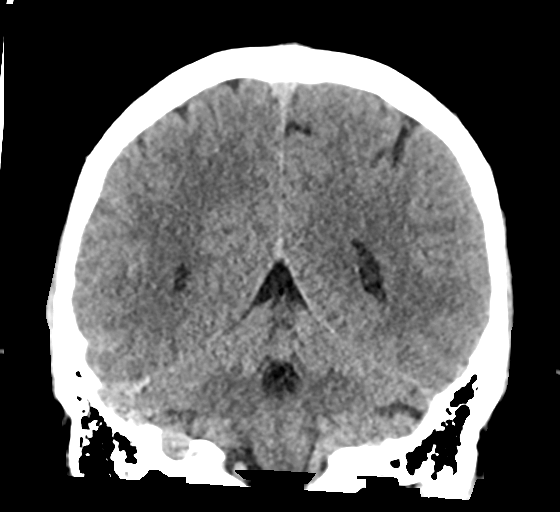
[im 31/69  brain]
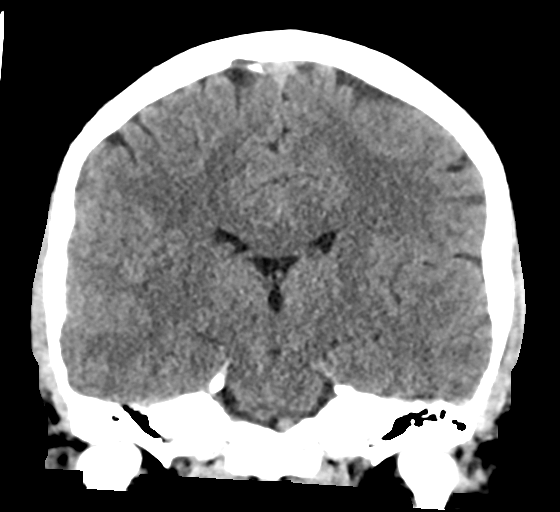
[im 38/69  brain]
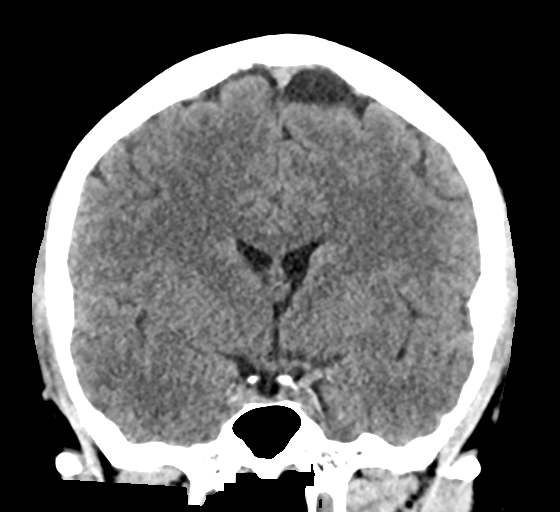

[Series 6: sag soft · sagittal · 0.34mm/px · 3 of 59 slices shown]
[im 20/59  brain]
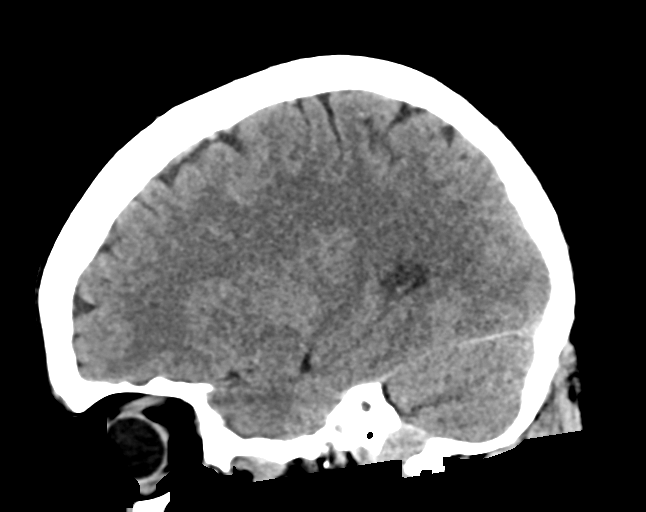
[im 30/59  brain]
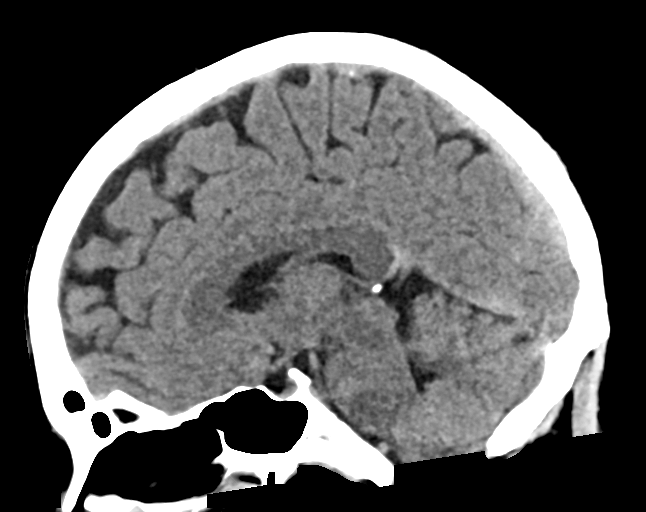
[im 39/59  brain]
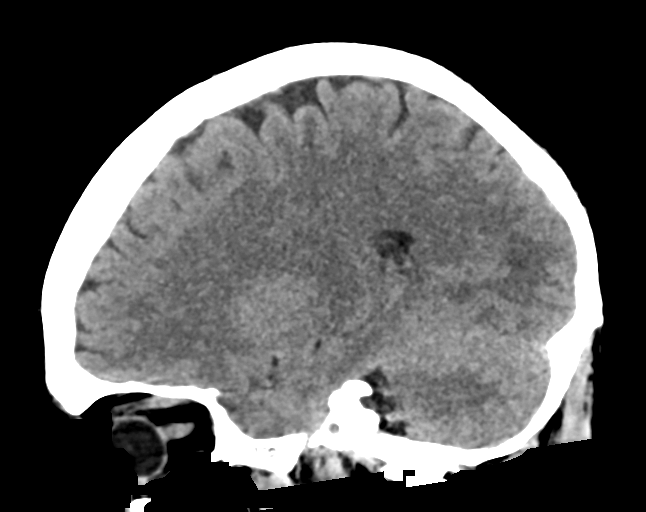

[15 of 47 positions shown; findings below may reference images not displayed]

FINDINGS: Brain: No evidence of acute infarction, hemorrhage, hydrocephalus,
extra-axial collection or mass lesion/mass effect.

Vascular: No hyperdense vessel or unexpected calcification.

Skull: Normal. Negative for fracture or focal lesion.

Sinuses/Orbits: No acute finding.

Other: None.
IMPRESSION: No acute intracranial findings.

## 2024-02-27 IMAGING — CR DG KNEE COMPLETE 4+V*R*
1 series · 4 of 4 positions shown · non-contrast
Comparison: None.

CLINICAL DATA: 6 feet fall from ladder

EXAM:
RIGHT KNEE - COMPLETE 4+ VIEW

[Series 1: dg knee complete 4 views right · 0.14mm/px · 4 of 4 slices shown]
[im 1/4]
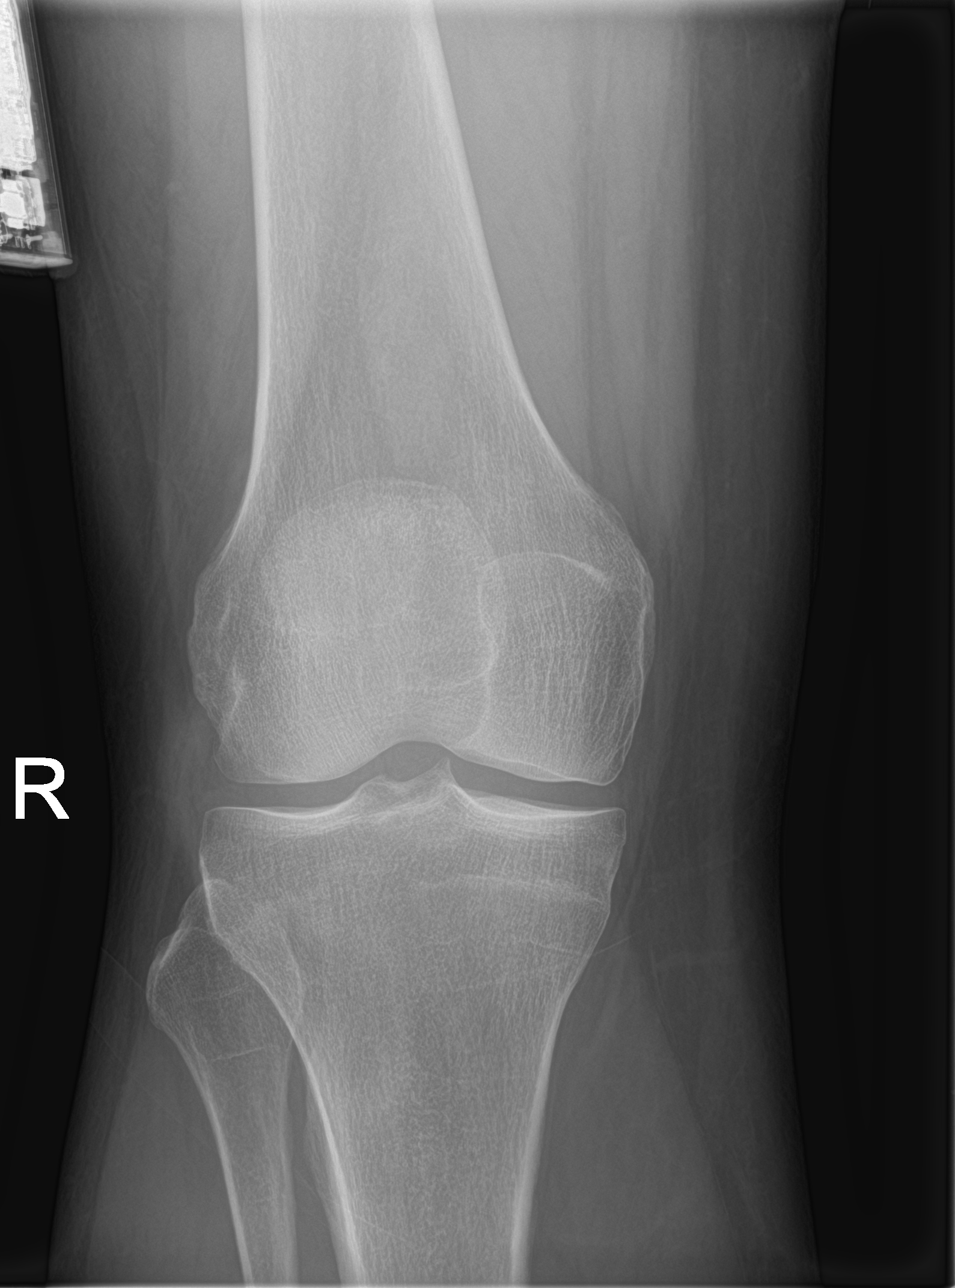
[im 2/4]
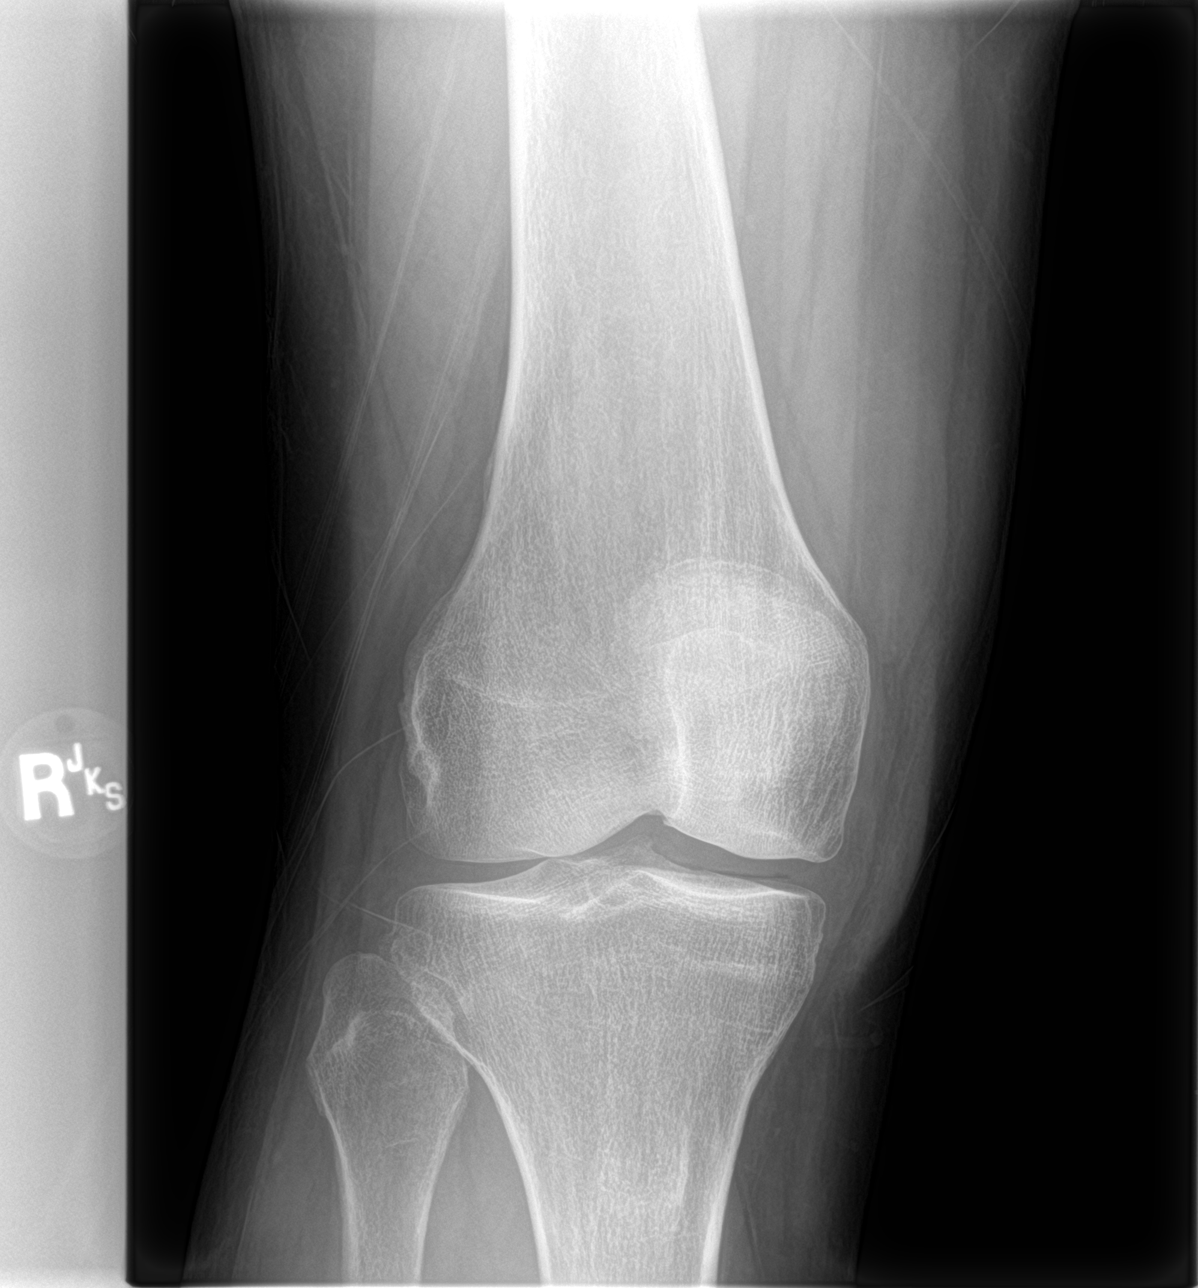
[im 3/4]
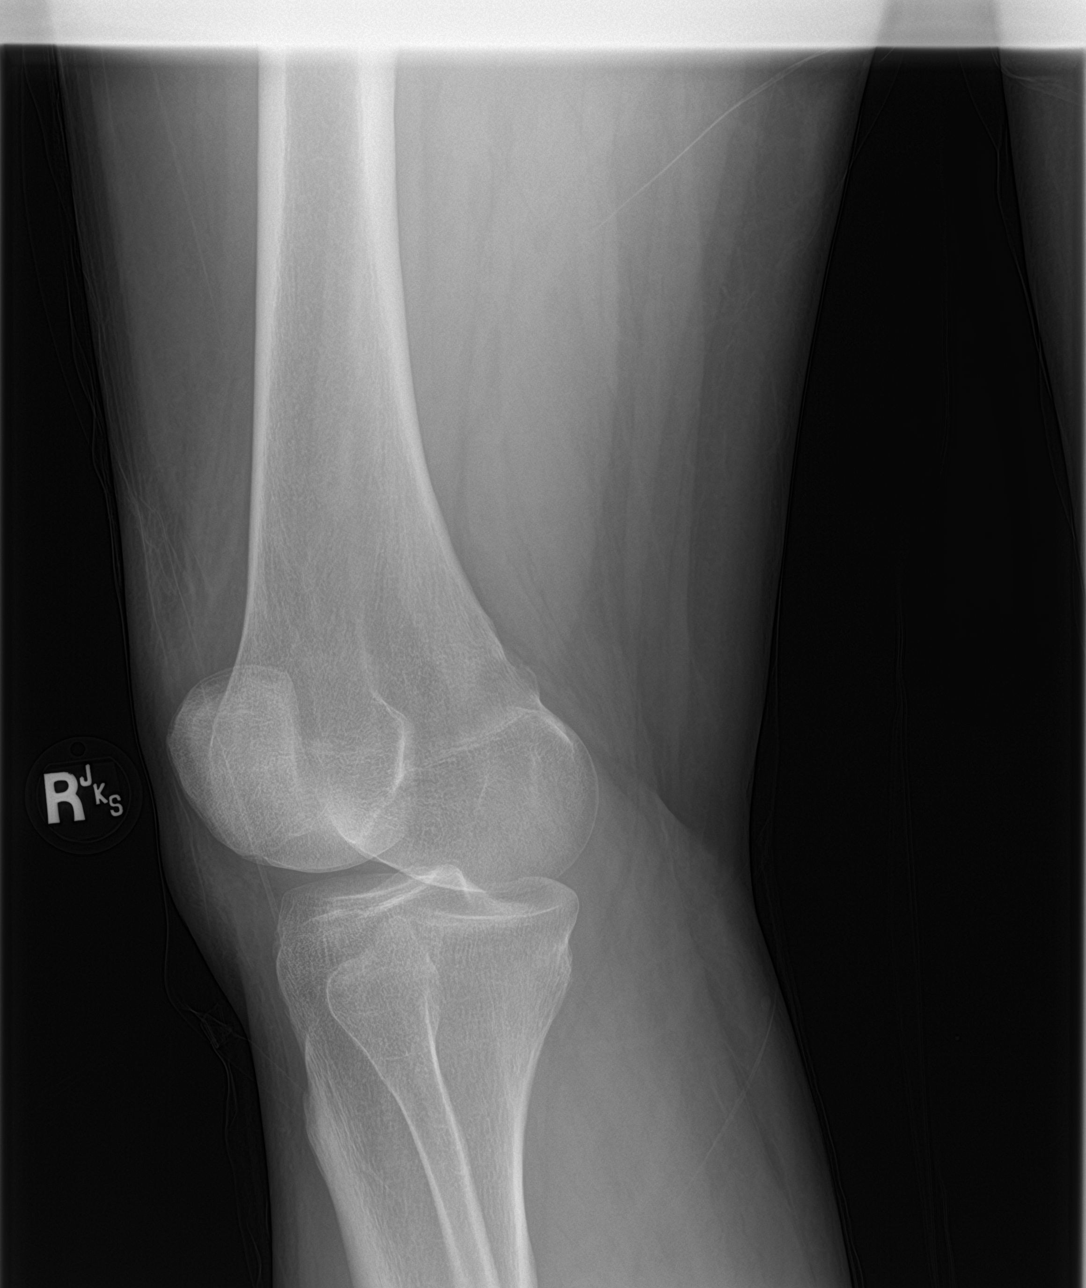
[im 4/4]
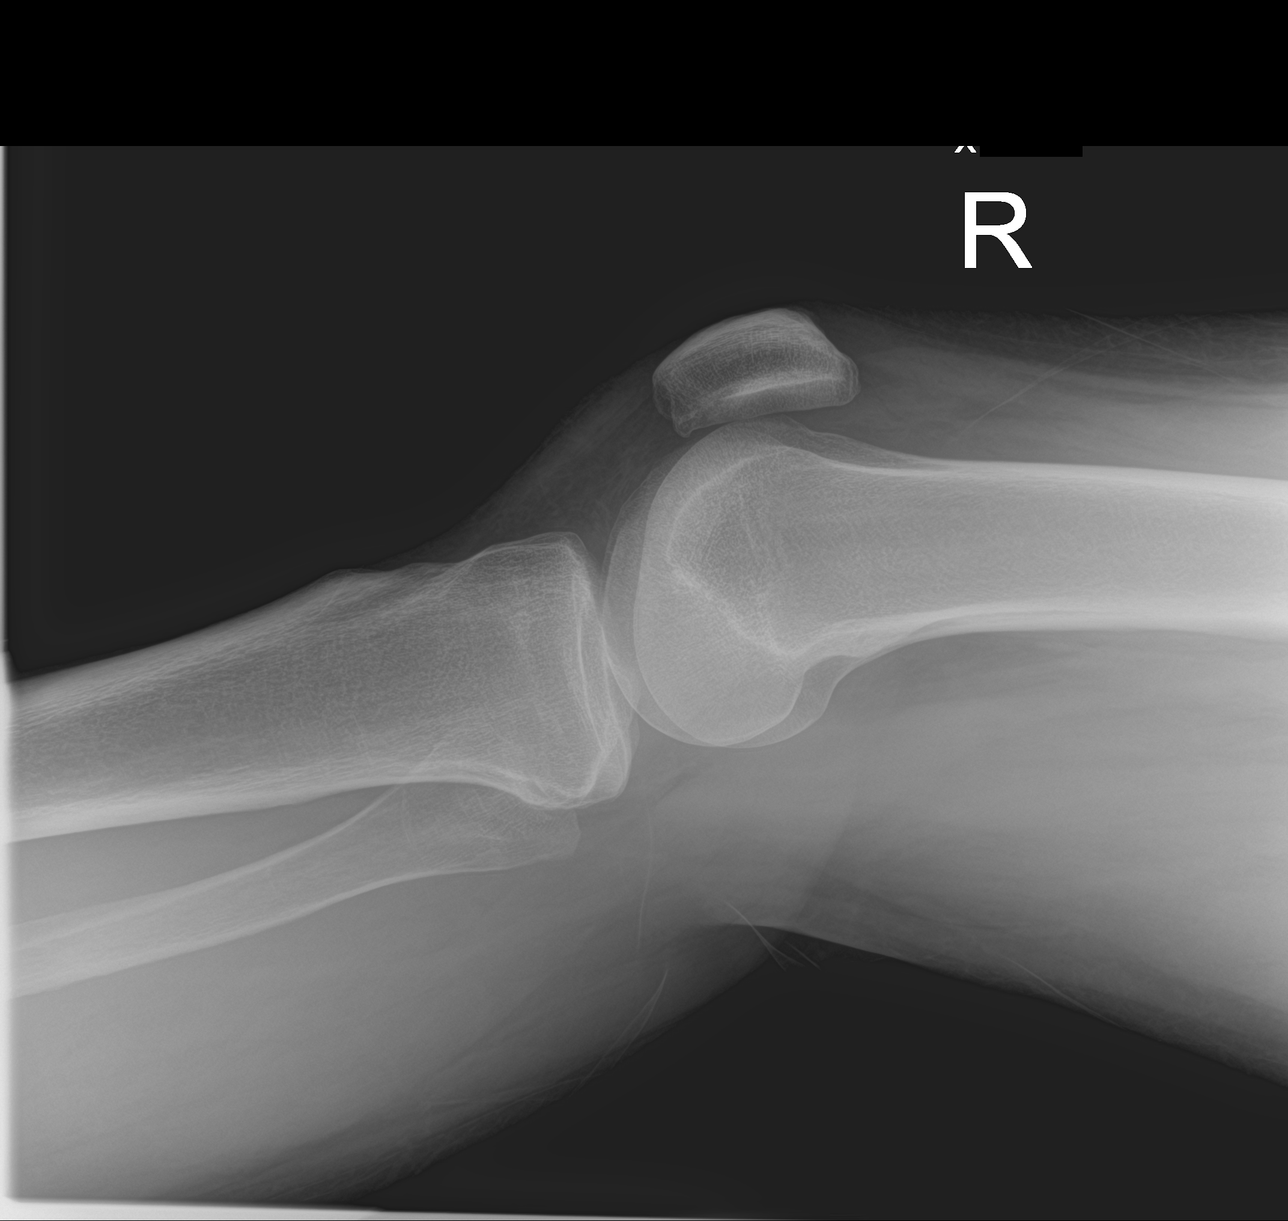

[4 of 4 positions shown; findings below may reference images not displayed]

FINDINGS: No evidence of fracture, dislocation, or joint effusion. No evidence
of arthropathy or other focal bone abnormality. Soft tissues are
unremarkable.
IMPRESSION: Negative.
